# Patient Record
Sex: Male | Born: 1975 | Race: White | Hispanic: No | Marital: Married | Smoking: Current every day smoker
Health system: Southern US, Community
[De-identification: ages and names within clinical notes are randomized; demographics above are authoritative.]

## PROBLEM LIST (undated history)

## (undated) DIAGNOSIS — E109 Type 1 diabetes mellitus without complications: Secondary | ICD-10-CM

## (undated) HISTORY — DX: Type 1 diabetes mellitus without complications: E10.9

---

## 2005-09-25 ENCOUNTER — Ambulatory Visit: Payer: Self-pay | Admitting: Family Medicine

## 2009-02-23 DIAGNOSIS — E119 Type 2 diabetes mellitus without complications: Secondary | ICD-10-CM

## 2009-02-23 HISTORY — DX: Type 2 diabetes mellitus without complications: E11.9

## 2010-11-19 DIAGNOSIS — E785 Hyperlipidemia, unspecified: Secondary | ICD-10-CM | POA: Insufficient documentation

## 2010-11-19 DIAGNOSIS — E782 Mixed hyperlipidemia: Secondary | ICD-10-CM | POA: Insufficient documentation

## 2010-11-19 DIAGNOSIS — E119 Type 2 diabetes mellitus without complications: Secondary | ICD-10-CM | POA: Insufficient documentation

## 2011-03-11 DIAGNOSIS — F17200 Nicotine dependence, unspecified, uncomplicated: Secondary | ICD-10-CM | POA: Insufficient documentation

## 2011-03-11 DIAGNOSIS — Z720 Tobacco use: Secondary | ICD-10-CM | POA: Insufficient documentation

## 2011-03-11 DIAGNOSIS — K900 Celiac disease: Secondary | ICD-10-CM | POA: Insufficient documentation

## 2011-03-13 DIAGNOSIS — E139 Other specified diabetes mellitus without complications: Secondary | ICD-10-CM | POA: Insufficient documentation

## 2011-11-25 ENCOUNTER — Ambulatory Visit: Payer: Self-pay | Admitting: Family Medicine

## 2011-12-04 ENCOUNTER — Ambulatory Visit: Payer: Self-pay | Admitting: Family Medicine

## 2012-07-18 ENCOUNTER — Emergency Department: Payer: Self-pay | Admitting: Emergency Medicine

## 2012-07-18 LAB — BASIC METABOLIC PANEL
Anion Gap: 10 (ref 7–16)
BUN: 17 mg/dL (ref 7–18)
Chloride: 99 mmol/L (ref 98–107)
Co2: 23 mmol/L (ref 21–32)
Creatinine: 0.83 mg/dL (ref 0.60–1.30)
EGFR (African American): >60
Osmolality: 281 (ref 275–301)
Potassium: 3.4 mmol/L — ABNORMAL LOW (ref 3.5–5.1)

## 2012-07-18 LAB — CBC
HCT: 46.6 % (ref 40.0–52.0)
HGB: 16.2 g/dL (ref 13.0–18.0)
MCH: 31.7 pg (ref 26.0–34.0)
MCHC: 34.8 g/dL (ref 32.0–36.0)
MCV: 91 fL (ref 80–100)
Platelet: 302 10*3/uL (ref 150–440)
RDW: 12.6 % (ref 11.5–14.5)
WBC: 13.2 10*3/uL — ABNORMAL HIGH (ref 3.8–10.6)

## 2012-07-18 LAB — TROPONIN I: Troponin-I: <0.02 ng/mL

## 2012-07-19 LAB — CK TOTAL AND CKMB (NOT AT ARMC): CK-MB: 0.8 ng/mL (ref 0.5–3.6)

## 2012-08-31 DIAGNOSIS — R5383 Other fatigue: Secondary | ICD-10-CM | POA: Insufficient documentation

## 2014-05-22 DIAGNOSIS — I10 Essential (primary) hypertension: Secondary | ICD-10-CM | POA: Insufficient documentation

## 2014-11-02 IMAGING — CR DG CHEST 2V
1 series · 2 of 2 positions shown · non-contrast
Comparison: none

REASON FOR EXAM: Chest Pain
COMMENTS:

PROCEDURE:     DXR - DXR CHEST PA (OR AP) AND LATERAL  - July 18, 2012  [DATE]
RESULT:     Comparison: None.

[Series 1: w chest pa · 0.14mm/px · 2 of 2 slices shown]
[im 1/2]
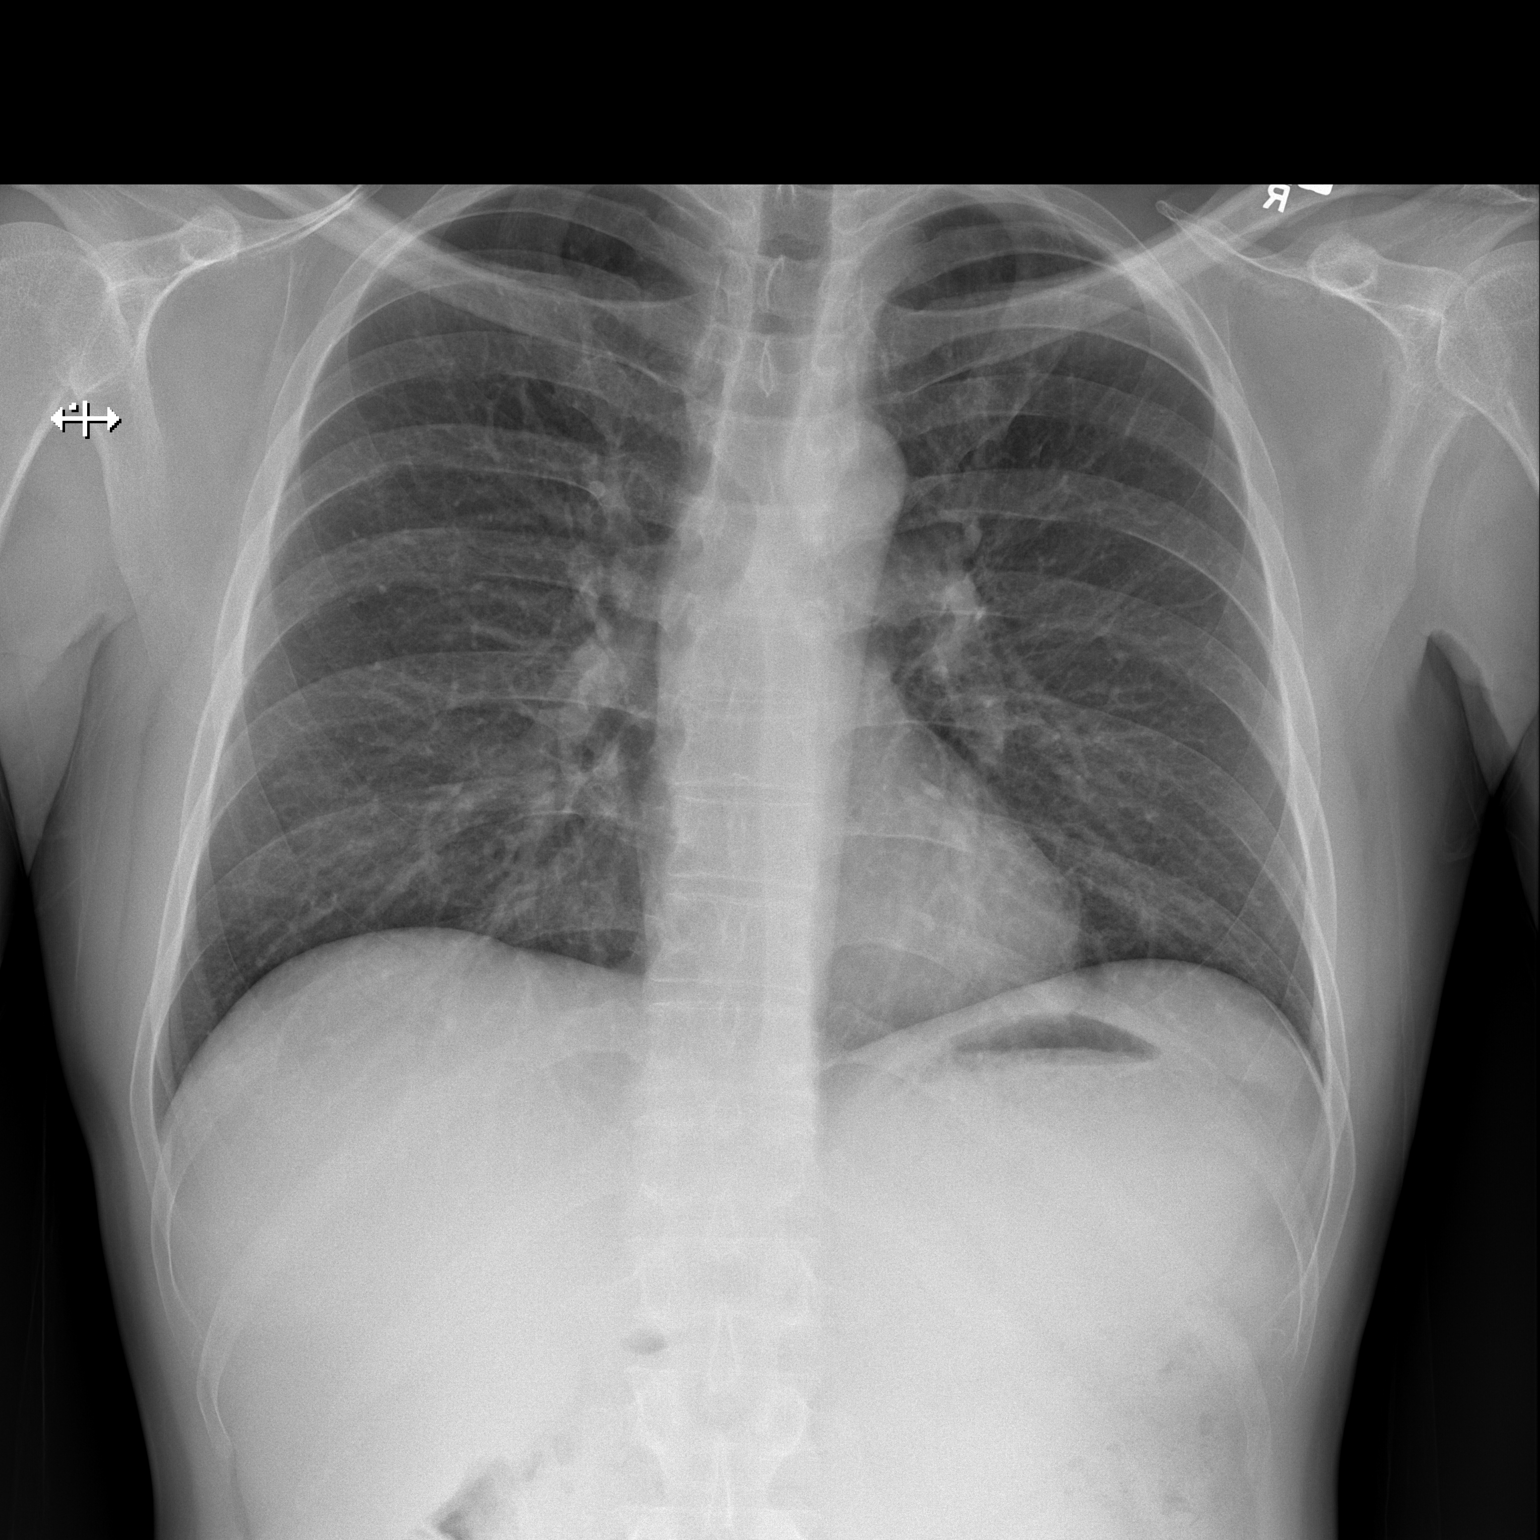
[im 2/2]
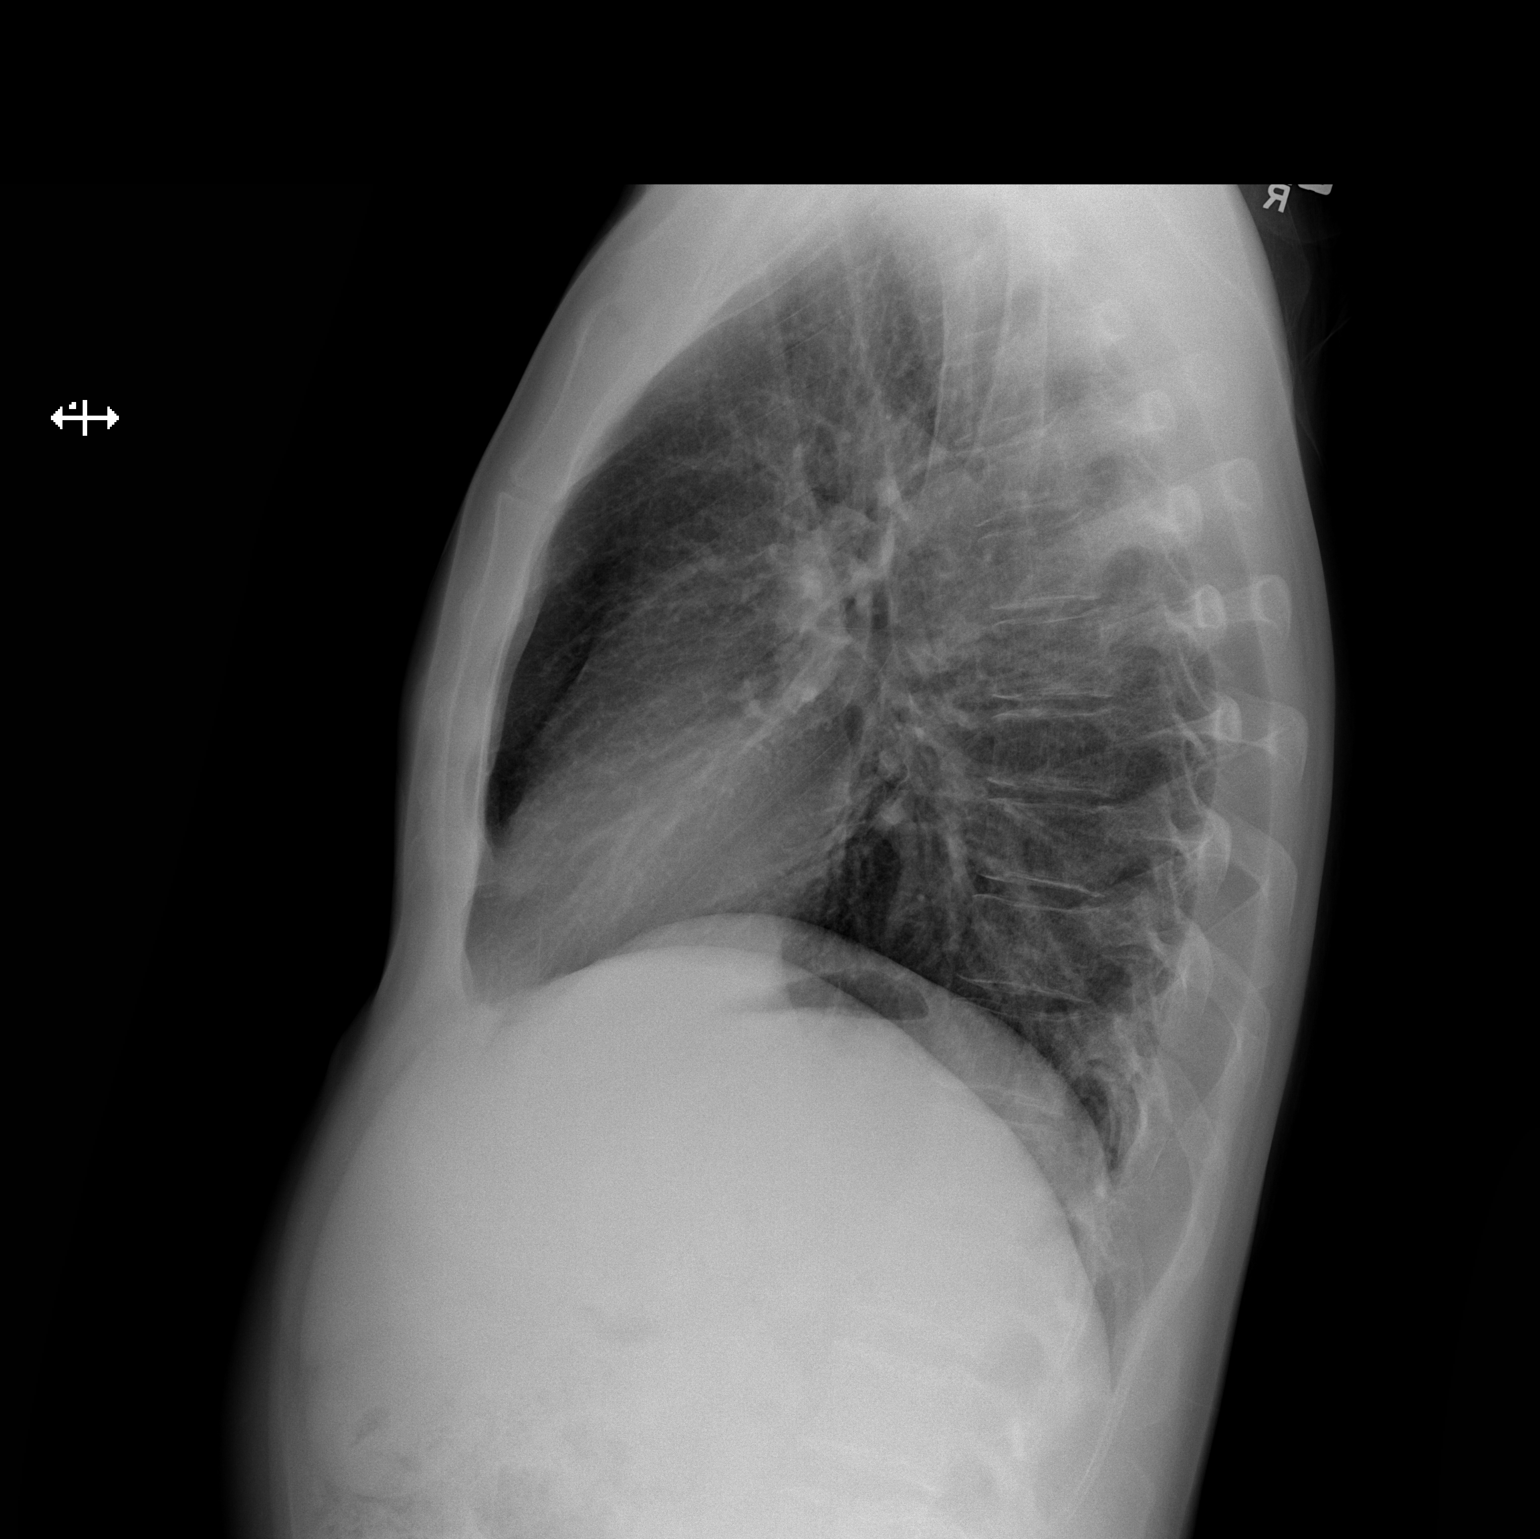

[2 of 2 positions shown; findings below may reference images not displayed]

FINDINGS: The heart and mediastinum are within normal limits. No focal pulmonary
opacities.
IMPRESSION: No acute cardiopulmonary disease.

[REDACTED]

## 2015-02-26 DIAGNOSIS — R6882 Decreased libido: Secondary | ICD-10-CM | POA: Insufficient documentation

## 2015-10-16 DIAGNOSIS — M2550 Pain in unspecified joint: Secondary | ICD-10-CM | POA: Insufficient documentation

## 2015-10-16 DIAGNOSIS — L732 Hidradenitis suppurativa: Secondary | ICD-10-CM | POA: Insufficient documentation

## 2019-03-16 ENCOUNTER — Ambulatory Visit: Payer: BLUE CROSS/BLUE SHIELD | Attending: Internal Medicine

## 2019-03-16 DIAGNOSIS — Z20822 Contact with and (suspected) exposure to covid-19: Secondary | ICD-10-CM | POA: Insufficient documentation

## 2019-03-17 LAB — NOVEL CORONAVIRUS, NAA: SARS-CoV-2, NAA: NOT DETECTED

## 2019-10-25 ENCOUNTER — Other Ambulatory Visit: Payer: Self-pay | Admitting: Unknown Physician Specialty

## 2019-10-25 ENCOUNTER — Other Ambulatory Visit: Payer: Self-pay

## 2019-10-25 ENCOUNTER — Ambulatory Visit
Admission: RE | Admit: 2019-10-25 | Discharge: 2019-10-25 | Disposition: A | Discharge status: Home / Self Care | Payer: BLUE CROSS/BLUE SHIELD | Attending: Unknown Physician Specialty | Admitting: Unknown Physician Specialty

## 2019-10-25 ENCOUNTER — Ambulatory Visit
Admission: RE | Admit: 2019-10-25 | Discharge: 2019-10-25 | Disposition: A | Discharge status: Home / Self Care | Payer: BLUE CROSS/BLUE SHIELD | Source: Ambulatory Visit | Attending: Unknown Physician Specialty | Admitting: Unknown Physician Specialty

## 2019-10-25 DIAGNOSIS — R053 Chronic cough: Secondary | ICD-10-CM

## 2019-10-25 DIAGNOSIS — R0781 Pleurodynia: Secondary | ICD-10-CM | POA: Diagnosis present

## 2019-10-25 DIAGNOSIS — S2239XA Fracture of one rib, unspecified side, initial encounter for closed fracture: Secondary | ICD-10-CM

## 2019-10-25 DIAGNOSIS — R05 Cough: Secondary | ICD-10-CM | POA: Insufficient documentation

## 2021-05-08 ENCOUNTER — Other Ambulatory Visit: Payer: BLUE CROSS/BLUE SHIELD

## 2021-05-08 ENCOUNTER — Other Ambulatory Visit: Payer: Self-pay

## 2021-05-08 NOTE — Progress Notes (Signed)
Pt cleared pre-employment UDS, HR notified. 

## 2021-08-19 ENCOUNTER — Ambulatory Visit: Payer: BLUE CROSS/BLUE SHIELD

## 2021-08-19 DIAGNOSIS — Z0000 Encounter for general adult medical examination without abnormal findings: Secondary | ICD-10-CM

## 2021-08-19 LAB — POCT URINALYSIS DIPSTICK
Bilirubin, UA: NEGATIVE
Blood, UA: NEGATIVE
Glucose, UA: POSITIVE — AB
Ketones, UA: NEGATIVE
Leukocytes, UA: NEGATIVE
Nitrite, UA: NEGATIVE
Protein, UA: POSITIVE — AB
Spec Grav, UA: >=1.03 — AB (ref 1.010–1.025)
Urobilinogen, UA: 0.2 E.U./dL
pH, UA: 5.5 (ref 5.0–8.0)

## 2021-08-19 NOTE — Progress Notes (Signed)
?  Pt presents today for physical labs, will return to clinic for scheduled physical.  

## 2021-08-20 ENCOUNTER — Ambulatory Visit: Payer: 59 | Admitting: Physician Assistant

## 2021-08-20 ENCOUNTER — Encounter: Payer: Self-pay | Admitting: Physician Assistant

## 2021-08-20 VITALS — BP 135/90 | HR 86 | Temp 97.6°F | Resp 14 | Ht 70.0 in | Wt 150.0 lb

## 2021-08-20 DIAGNOSIS — E109 Type 1 diabetes mellitus without complications: Secondary | ICD-10-CM

## 2021-08-20 LAB — CMP12+LP+TP+TSH+6AC+PSA+CBC…
ALT: 29 IU/L (ref 0–44)
AST: 22 IU/L (ref 0–40)
Albumin/Globulin Ratio: 1.6 (ref 1.2–2.2)
Albumin: 4.3 g/dL (ref 4.0–5.0)
Alkaline Phosphatase: 153 IU/L — ABNORMAL HIGH (ref 44–121)
BUN/Creatinine Ratio: 20 (ref 9–20)
BUN: 16 mg/dL (ref 6–24)
Basophils Absolute: 0.1 10*3/uL (ref 0.0–0.2)
Basos: 1 %
Bilirubin Total: 0.3 mg/dL (ref 0.0–1.2)
Calcium: 9.4 mg/dL (ref 8.7–10.2)
Chloride: 100 mmol/L (ref 96–106)
Chol/HDL Ratio: 6.6 ratio — ABNORMAL HIGH (ref 0.0–5.0)
Cholesterol, Total: 184 mg/dL (ref 100–199)
Creatinine, Ser: 0.82 mg/dL (ref 0.76–1.27)
EOS (ABSOLUTE): 0.2 10*3/uL (ref 0.0–0.4)
Eos: 2 %
Estimated CHD Risk: 1.4 times avg. — ABNORMAL HIGH (ref 0.0–1.0)
Free Thyroxine Index: 1.8 (ref 1.2–4.9)
GGT: 11 IU/L (ref 0–65)
Globulin, Total: 2.7 g/dL (ref 1.5–4.5)
Glucose: 155 mg/dL — ABNORMAL HIGH (ref 70–99)
HDL: 28 mg/dL — ABNORMAL LOW (ref >39)
Hematocrit: 48.2 % (ref 37.5–51.0)
Hemoglobin: 16.5 g/dL (ref 13.0–17.7)
Immature Grans (Abs): 0.1 10*3/uL (ref 0.0–0.1)
Immature Granulocytes: 1 %
Iron: 78 ug/dL (ref 38–169)
LDH: 157 IU/L (ref 121–224)
LDL Chol Calc (NIH): 140 mg/dL — ABNORMAL HIGH (ref 0–99)
Lymphocytes Absolute: 3.2 10*3/uL — ABNORMAL HIGH (ref 0.7–3.1)
Lymphs: 33 %
MCH: 30.8 pg (ref 26.6–33.0)
MCHC: 34.2 g/dL (ref 31.5–35.7)
MCV: 90 fL (ref 79–97)
Monocytes Absolute: 0.6 10*3/uL (ref 0.1–0.9)
Monocytes: 6 %
Neutrophils Absolute: 5.6 10*3/uL (ref 1.4–7.0)
Neutrophils: 57 %
Phosphorus: 3.5 mg/dL (ref 2.8–4.1)
Platelets: 369 10*3/uL (ref 150–450)
Potassium: 5.4 mmol/L — ABNORMAL HIGH (ref 3.5–5.2)
Prostate Specific Ag, Serum: 0.5 ng/mL (ref 0.0–4.0)
RBC: 5.35 x10E6/uL (ref 4.14–5.80)
RDW: 12.8 % (ref 11.6–15.4)
Sodium: 137 mmol/L (ref 134–144)
T3 Uptake Ratio: 27 % (ref 24–39)
T4, Total: 6.8 ug/dL (ref 4.5–12.0)
TSH: 1.02 u[IU]/mL (ref 0.450–4.500)
Total Protein: 7 g/dL (ref 6.0–8.5)
Triglycerides: 84 mg/dL (ref 0–149)
Uric Acid: 4 mg/dL (ref 3.8–8.4)
VLDL Cholesterol Cal: 16 mg/dL (ref 5–40)
WBC: 9.7 10*3/uL (ref 3.4–10.8)
eGFR: 108 mL/min/{1.73_m2} (ref >59)

## 2021-08-20 LAB — POCT GLYCOSYLATED HEMOGLOBIN (HGB A1C): Hemoglobin A1C: 7.6 % — AB (ref 4.0–5.6)

## 2021-08-20 NOTE — Addendum Note (Signed)
Addended by: Gardner Candle on: 08/20/2021 10:03 AM   Modules accepted: Orders

## 2021-08-20 NOTE — Progress Notes (Signed)
[facility] ____________________________________________   None    (approximate)  I have reviewed the triage vital signs and the nursing notes.   HISTORY  Chief Complaint Annual Exam   HPI Jeremiah Becker is a 46 y.o. male patient is a new however here today for annual physical exam.  Patient has a history of type 1 diabetes.  Patient was diagnosed with LADA in 2014.  Patient are) he became aware he is a diabetic in 2011.  Patient was taken insulin (NovoLog) until 2015 when due to unemployment he is no longer had insurance.  Patient was told by his endocrinologist to start using Novolin (R) on a sliding scale and Novolin (A) 12 units at night.  Patient fingerstick hemoglobin A1c was 7.6 today.  Fasting glucose yesterday was 155.  Microalbumin results are pending.      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 No pertinent surgical history.  Prior to Admission medications   Medication Sig Start Date End Date Taking? Authorizing Provider  blood glucose meter kit and supplies Use as directed. Check blood sugars daily. Occassionally fasting and occasionly post eating. 02/18/10  Yes [provider]  Dextrose, Diabetic Use, (RELION GLUCOSE PO) Take 10 units of lipase by mouth. Pt injects 10 units 3 times daily depending on what's consumed.   Yes [provider]    Allergies Patient has no known allergies.  Family History  Problem Relation Age of Onset   Diabetes Father     Social History Social History   Tobacco Use   Smoking status: Every Day    Types: Cigarettes   Smokeless tobacco: Never   Substance Use Topics   Alcohol use: Never   Drug use: Never    Review of Systems  Constitutional: No fever/chills Eyes: No visual changes. ENT: No sore throat. Cardiovascular: Denies chest pain. Respiratory: Denies shortness of breath. Gastrointestinal: No abdominal pain.  No nausea, no vomiting.  No diarrhea.  No constipation. Genitourinary: Negative for dysuria. Musculoskeletal: Negative for back pain. Skin: Negative for rash. Neurological: Negative for headaches, focal weakness or numbness.   Endocrine: Diabetes  ____________________________________________   PHYSICAL EXAM:  VITAL SIGNS: BP is 135/94, pulse 86, respiration 14, temperature 97.6, patient 97% O2 sat on room air.  Patient was 150 pounds and BMI is 21.52. Constitutional: Alert and oriented. Well appearing and in no acute distress. Eyes: Conjunctivae are normal. PERRL. EOMI. Head: Atraumatic. Nose: No congestion/rhinnorhea. Mouth/Throat: Mucous membranes are moist.  Oropharynx non-erythematous. Neck: No stridor.  No cervical spine tenderness to palpation. Hematological/Lymphatic/Immunilogical: No cervical lymphadenopathy. Cardiovascular: Normal rate, regular rhythm. Grossly normal heart sounds.  Good peripheral circulation. Respiratory: Normal respiratory effort.  No retractions. Lungs CTAB. Gastrointestinal: Soft and nontender. No distention. No abdominal bruits. No CVA tenderness. Genitourinary: Deferred Musculoskeletal: No lower extremity tenderness nor edema.  No joint effusions. Neurologic:  Normal speech and language. No gross focal neurologic deficits are appreciated. No gait instability.  Diabetic testicle sensory evaluation was normal. Skin:  Skin is warm, dry and intact. No rash noted. Psychiatric: Mood and affect are normal. Speech and behavior are normal.  ____________________________________________  LABS     Component Ref Range & Units 10:30  Hemoglobin A1C 4.0 - 5.6 % 7.6 Abnormal     HbA1c POC (<> result, manual entry)    HbA1c, POC (prediabetic range)    HbA1c, POC (controlled diabetic              Component Ref Range & Units 1 d ago (08/19/21) 9 yr ago (07/18/12) 9 yr ago (07/18/12)  Glucose 70 - 99 mg/dL 155 High   370 High  R    Uric Acid 3.8 - 8.4 mg/dL 4.0     Comment:            Therapeutic target for gout patients: <6.0  BUN 6 - 24 mg/dL 16  17 R    Creatinine, Ser 0.76 - 1.27 mg/dL 0.82  0.83 R    eGFR >59 mL/min/1.73 108     BUN/Creatinine Ratio 9 - 20 20     Sodium 134 - 144 mmol/L 137  132 Low  R    Potassium 3.5 - 5.2 mmol/L 5.4 High   3.4 Low  R    Chloride 96 - 106 mmol/L 100  99 R    Calcium 8.7 - 10.2 mg/dL 9.4  8.7 R    Phosphorus 2.8 - 4.1 mg/dL 3.5     Total Protein 6.0 - 8.5 g/dL 7.0     Albumin 4.0 - 5.0 g/dL 4.3     Comment:                **Effective September 01, 2021 Albumin reference interval**                   will be changing to:                              Age                  Male          Male                             0 -   7 days       3.6 - 4.9      3.6 - 4.9                             8 -  30 days       3.5 - 4.6      3.5 - 4.6                             1 -   6 months     3.7 - 4.8      3.7 - 4.8                      7 months -   2 years      4.0 - 5.0      4.0 - 5.0                             3 -   5 years      4.1 - 5.0      4.1 - 5.0  6 -  12 years      4.2 - 5.0      4.2 - 5.0                            13 -  30 years      4.3 - 5.2      4.0 - 5.0                            31 -  50 years      4.1 - 5.1      3.9 - 4.9                            51 -  60 years      3.8 - 4.9      3.8 - 4.9                            61 -  70 years      3.9 - 4.9      3.9 - 4.9                            71 -  80 years      3.8 - 4.8      3.8 - 4.8                            81 -  89 years      3.7 - 4.7      3.7 - 4.7                            90 - 199 years      3.6 - 4.6      3.6 - 4.6   Globulin,  Total 1.5 - 4.5 g/dL 2.7     Albumin/Globulin Ratio 1.2 - 2.2 1.6     Bilirubin Total 0.0 - 1.2 mg/dL 0.3     Alkaline Phosphatase 44 - 121 IU/L 153 High      LDH 121 - 224 IU/L 157     AST 0 - 40 IU/L 22     ALT 0 - 44 IU/L 29     GGT 0 - 65 IU/L 11     Iron 38 - 169 ug/dL 78     Cholesterol, Total 100 - 199 mg/dL 184     Triglycerides 0 - 149 mg/dL 84     HDL >39 mg/dL 28 Low      VLDL Cholesterol Cal 5 - 40 mg/dL 16     LDL Chol Calc (NIH) 0 - 99 mg/dL 140 High      Chol/HDL Ratio 0.0 - 5.0 ratio 6.6 High      Comment:                                   T. Chol/HDL Ratio  Men  Women                                1/2 Avg.Risk  3.4    3.3                                    Avg.Risk  5.0    4.4                                 2X Avg.Risk  9.6    7.1                                 3X Avg.Risk 23.4   11.0   Estimated CHD Risk 0.0 - 1.0 times avg. 1.4 High      Comment: The CHD Risk is based on the T. Chol/HDL ratio. Other  factors affect CHD Risk such as hypertension, smoking,  diabetes, severe obesity, and family history of  premature CHD.   TSH 0.450 - 4.500 uIU/mL 1.020     T4, Total 4.5 - 12.0 ug/dL 6.8     T3 Uptake Ratio 24 - 39 % 27     Free Thyroxine Index 1.2 - 4.9 1.8     Prostate Specific Ag, Serum 0.0 - 4.0 ng/mL 0.5     Comment: Roche ECLIA methodology.  According to the American Urological Association, Serum PSA should  decrease and remain at undetectable levels after radical  prostatectomy. The AUA defines biochemical recurrence as an initial  PSA value 0.2 ng/mL or greater followed by a subsequent confirmatory  PSA value 0.2 ng/mL or greater.  Values obtained with different assay methods or kits cannot be used  interchangeably. Results cannot be interpreted as absolute evidence  of the presence or absence of malignant disease.   WBC 3.4 - 10.8 x10E3/uL 9.7   13.2 High  R   RBC 4.14 - 5.80 x10E6/uL 5.35   5.12 R    Hemoglobin 13.0 - 17.7 g/dL 16.5     Hematocrit 37.5 - 51.0 % 48.2     MCV 79 - 97 fL 90   91 R   MCH 26.6 - 33.0 pg 30.8   31.7 R   MCHC 31.5 - 35.7 g/dL 34.2   34.8 R   RDW 11.6 - 15.4 % 12.8   12.6 R   Platelets 150 - 450 x10E3/uL 369   302 R   Neutrophils Not Estab. % 57     Lymphs Not Estab. % 33     Monocytes Not Estab. % 6     Eos Not Estab. % 2     Basos Not Estab. % 1     Neutrophils Absolute 1.4 - 7.0 x10E3/uL 5.6     Lymphocytes Absolute 0.7 - 3.1 x10E3/uL 3.2 High      Monocytes Absolute 0.1 - 0.9 x10E3/uL 0.6     EOS (ABSOLUTE) 0.0 - 0.4 x10E3/uL 0.2     Basophils Absolute 0.0 - 0.2 x10E3/uL 0.1     Immature Granulocytes Not Estab. % 1     Immature Grans          _____     Component Ref Range & Units 1 d ago  Color, UA  Dark Programmer, applications, UA  Clear   Glucose, UA Negative Positive Abnormal    Comment: 3+ (Diabetic - takes insulin)  Bilirubin, UA  Negative   Ketones, UA  Negative   Spec Grav, UA 1.010 - 1.025 >=1.030 Abnormal    Blood, UA  Negative   pH, UA 5.0 - 8.0 5.5   Protein, UA Negative Positive Abnormal    Comment: 1+  Urobilinogen, UA 0.2 or 1.0 E.U./dL 0.2   Nitrite, UA  Negative   Leukocytes, UA Negative Negative   Appearance          _______________________________________  EKG Sinus  Rhythm at 80 bpm WITHIN NORMAL LIMITS   ____________________________________________     ____________________________________________   INITIAL IMPRESSION / ASSESSMENT AND PLAN  As part of my medical decision making, I reviewed the following data within the electronic MEDICAL RECORD NUMBER      Discussed lab and EKG results with patient.  Patient advised to continue using Novolin until evaluation by endocrinologist.        ____________________________________________   FINAL CLINICAL IMPRESSION  Well exam for diagnosis of type 1 diabetes.   ED Discharge Orders          Ordered    Microalbumin / creatinine urine ratio        08/20/21  1026    Hgb A1c w/o eAG  Status:  Canceled        08/20/21 1026    POCT glycosylated hemoglobin (Hb A1C)        08/20/21 1030             Note:  This document was prepared using Dragon voice recognition software and may include unintentional dictation errors.

## 2021-08-20 NOTE — Progress Notes (Signed)
Pt presents today to complete physical, Pt denies any issues or concerns at this time/CL,RMA 

## 2021-08-21 ENCOUNTER — Encounter: Payer: BLUE CROSS/BLUE SHIELD | Admitting: Physician Assistant

## 2021-08-21 LAB — MICROALBUMIN / CREATININE URINE RATIO
Creatinine, Urine: 143.2 mg/dL
Microalb/Creat Ratio: 18 mg/g creat (ref 0–29)
Microalbumin, Urine: 26.3 ug/mL

## 2021-08-21 LAB — SPECIMEN STATUS REPORT

## 2021-08-21 LAB — HGB A1C W/O EAG: Hgb A1c MFr Bld: 9.6 % — ABNORMAL HIGH (ref 4.8–5.6)

## 2021-08-21 NOTE — Addendum Note (Signed)
Addended by: Gardner Candle on: 08/21/2021 10:12 AM   Modules accepted: Orders

## 2021-08-21 NOTE — Progress Notes (Signed)
Karren Burly previously went to [facility].  He last saw Lula Olszewski, [place] & Gerome Sam, MD.  Last documentation under Care Everywhere is 2014.  Nona Dell, PA-C is requesting an Endo referral for patient.  Contacted Dwight to see if he wants to go back to [facility].  States it's been so long since he went there & he doesn't remember who he saw.  Informed Karren Burly that we have been sending Endo referrals to Dr. Marquis Lunch of [facility] & asked if he would like for me to send the referral there.  States he is interested & agreed to having referral sent to [facility].  AMD

## 2021-08-30 DIAGNOSIS — H5213 Myopia, bilateral: Secondary | ICD-10-CM | POA: Diagnosis not present

## 2021-09-25 ENCOUNTER — Ambulatory Visit (INDEPENDENT_AMBULATORY_CARE_PROVIDER_SITE_OTHER): Payer: 59 | Admitting: "Endocrinology

## 2021-09-25 ENCOUNTER — Encounter: Payer: Self-pay | Admitting: "Endocrinology

## 2021-09-25 VITALS — BP 118/72 | HR 96 | Ht 70.0 in | Wt 155.6 lb

## 2021-09-25 DIAGNOSIS — E782 Mixed hyperlipidemia: Secondary | ICD-10-CM | POA: Diagnosis not present

## 2021-09-25 DIAGNOSIS — E1065 Type 1 diabetes mellitus with hyperglycemia: Secondary | ICD-10-CM | POA: Diagnosis not present

## 2021-09-25 DIAGNOSIS — F17200 Nicotine dependence, unspecified, uncomplicated: Secondary | ICD-10-CM

## 2021-09-25 DIAGNOSIS — R69 Illness, unspecified: Secondary | ICD-10-CM | POA: Diagnosis not present

## 2021-09-25 MED ORDER — TRESIBA FLEXTOUCH 100 UNIT/ML ~~LOC~~ SOPN: 15.0000 [IU] | PEN_INJECTOR | Freq: Every day | SUBCUTANEOUS | 1 refills | Status: DC

## 2021-09-25 MED ORDER — FIASP FLEXTOUCH 100 UNIT/ML ~~LOC~~ SOPN: 5.0000 [IU] | PEN_INJECTOR | Freq: Three times a day (TID) | SUBCUTANEOUS | 1 refills | Status: DC

## 2021-09-25 MED ORDER — FREESTYLE LIBRE 3 SENSOR MISC: 1.0000 | 2 refills | Status: DC

## 2021-09-25 NOTE — Patient Instructions (Signed)
                                     Advice for Weight Management  -For most of us the best way to lose weight is by diet management. Generally speaking, diet management means consuming less calories intentionally which over time brings about progressive weight loss.  This can be achieved more effectively by avoiding ultra processed carbohydrates, processed meats, unhealthy fats.    It is critically important to know your numbers: how much calorie you are consuming and how much calorie you need. More importantly, our carbohydrates sources should be unprocessed naturally occurring  complex starch food items.  It is always important to balance nutrition also by  appropriate intake of proteins (mainly plant-based), healthy fats/oils, plenty of fruits and vegetables.   -The American College of Lifestyle Medicine (ACL M) recommends nutrition derived mostly from Whole Food, Plant Predominant Sources example an apple instead of applesauce or apple pie. Eat Plenty of vegetables, Mushrooms, fruits, Legumes, Whole Grains, Nuts, seeds in lieu of processed meats, processed snacks/pastries red meat, poultry, eggs.  Use only water or unsweetened tea for hydration.  The College also recommends the need to stay away from risky substances including alcohol, smoking; obtaining 7-9 hours of restorative sleep, at least 150 minutes of moderate intensity exercise weekly, importance of healthy social connections, and being mindful of stress and seek help when it is overwhelming.    -Sticking to a routine mealtime to eat 3 meals a day and avoiding unnecessary snacks is shown to have a big role in weight control. Under normal circumstances, the only time we burn stored energy is when we are hungry, so allow  some hunger to take place- hunger means no food between appropriate meal times, only water.  It is not advisable to starve.   -It is better to avoid simple carbohydrates including:  Cakes, Sweet Desserts, Ice Cream, Soda (diet and regular), Sweet Tea, Candies, Chips, Cookies, Store Bought Juices, Alcohol in Excess of  1-2 drinks a day, Lemonade,  Artificial Sweeteners, Doughnuts, Coffee Creamers, "Sugar-free" Products, etc, etc.  This is not a complete list.....    -Consulting with certified diabetes educators is proven to provide you with the most accurate and current information on diet.  Also, you may be  interested in discussing diet options/exchanges , we can schedule a visit with Jeremiah Becker, RDN, CDE for individualized nutrition education.  -Exercise: If you are able: 30 -60 minutes a day ,4 days a week, or 150 minutes of moderate intensity exercise weekly.    The longer the better if tolerated.  Combine stretch, strength, and aerobic activities.  If you were told in the past that you have high risk for cardiovascular diseases, or if you are currently symptomatic, you may seek evaluation by your heart doctor prior to initiating moderate to intense exercise programs.                                  Additional Care Considerations for Diabetes/Prediabetes   -Diabetes  is a chronic disease.  The most important care consideration is regular follow-up with your diabetes care provider with the goal being avoiding or delaying its complications and to take advantage of advances in medications and technology.  If appropriate actions are taken early enough, type 2 diabetes can even be   reversed.  Seek information from the right source.  - Whole Food, Plant Predominant Nutrition is highly recommended: Eat Plenty of vegetables, Mushrooms, fruits, Legumes, Whole Grains, Nuts, seeds in lieu of processed meats, processed snacks/pastries red meat, poultry, eggs as recommended by American College of  Lifestyle Medicine (ACLM).  -Type 2 diabetes is known to coexist with other important comorbidities such as high blood pressure and high cholesterol.  It is critical to control not only the  diabetes but also the high blood pressure and high cholesterol to minimize and delay the risk of complications including coronary artery disease, stroke, amputations, blindness, etc.  The good news is that this diet recommendation for type 2 diabetes is also very helpful for managing high cholesterol and high blood blood pressure.  - Studies showed that people with diabetes will benefit from a class of medications known as ACE inhibitors and statins.  Unless there are specific reasons not to be on these medications, the standard of care is to consider getting one from these groups of medications at an optimal doses.  These medications are generally considered safe and proven to help protect the heart and the kidneys.    - People with diabetes are encouraged to initiate and maintain regular follow-up with eye doctors, foot doctors, dentists , and if necessary heart and kidney doctors.     - It is highly recommended that people with diabetes quit smoking or stay away from smoking, and get yearly  flu vaccine and pneumonia vaccine at least every 5 years.  See above for additional recommendations on exercise, sleep, stress management , and healthy social connections.      

## 2021-09-25 NOTE — Progress Notes (Signed)
Endocrinology Consult Note       09/25/2021, 6:23 PM   Subjective:    Patient ID: Jeremiah Becker, male    DOB: Jun 23, 1975.  Jeremiah Becker is being seen in consultation for management of currently uncontrolled symptomatic diabetes requested by  Patient, No Pcp Per.   Past Medical History:  Diagnosis Date   Diabetes ([place]) 2011   Diabetes mellitus type I ([place])     History reviewed. No pertinent surgical history.  Social History   Socioeconomic History   Marital status: Married    Spouse name: Not on file   Number of children: Not on file   Years of education: Not on file   Highest education level: Not on file  Occupational History   Not on file  Tobacco Use   Smoking status: Every Day    Types: Cigarettes   Smokeless tobacco: Never  Vaping Use   Vaping Use: Never used  Substance and Sexual Activity   Alcohol use: Never   Drug use: Never   Sexual activity: Not on file  Other Topics Concern   Not on file  Social History Narrative   Not on file   Social Determinants of Health   Financial Resource Strain: Not on file  Food Insecurity: Not on file  Transportation Needs: Not on file  Physical Activity: Not on file  Stress: Not on file  Social Connections: Not on file    Family History  Problem Relation Age of Onset   Hypertension Father    Diabetes Father    Hyperlipidemia Father    Heart attack Father     Outpatient Encounter Medications as of 09/25/2021  Medication Sig   Continuous Blood Gluc Sensor (FREESTYLE LIBRE 3 SENSOR) MISC 1 Piece by Does not apply route every 14 (fourteen) days. Place 1 sensor on the skin every 14 days. Use to check glucose continuously   insulin aspart (FIASP FLEXTOUCH) 100 UNIT/ML FlexTouch Pen Inject 5-11 Units into the skin 3 (three) times daily before meals.   insulin degludec (TRESIBA FLEXTOUCH) 100 UNIT/ML FlexTouch Pen Inject 15 Units into the skin  at bedtime.   [DISCONTINUED] insulin NPH Human (NOVOLIN N) 100 UNIT/ML injection Inject 5 Units into the skin. 5 units with breakfast, 5 units at lunch, 5 units at supper and 10 units at bedtime   blood glucose meter kit and supplies Use as directed. Check blood sugars daily. Occassionally fasting and occasionly post eating.   Dextrose, Diabetic Use, (RELION GLUCOSE PO) Take 10 units of lipase by mouth. Pt injects 10 units 3 times daily depending on what's consumed.   No facility-administered encounter medications on file as of 09/25/2021.    ALLERGIES: No Known Allergies  VACCINATION STATUS: Immunization History  Administered Date(s) Administered   Influenza, Seasonal, Injecte, Preservative Fre 11/29/2012   Influenza-Unspecified 12/10/2010    Diabetes He presents for his initial diabetic visit. He has type 1 diabetes mellitus. Onset time: Was diagnosed at approximate age of 37 years. His disease course has been worsening. There are no hypoglycemic associated symptoms. Pertinent negatives for hypoglycemia include no confusion, headaches, pallor or seizures. Associated symptoms  include polydipsia and polyuria. Pertinent negatives for diabetes include no chest pain, no fatigue, no polyphagia and no weakness. There are no hypoglycemic complications. Symptoms are stable. There are no diabetic complications. Risk factors for coronary artery disease include dyslipidemia, male sex and tobacco exposure. Current diabetic treatments: Novolin N 5 units with breakfast, 5 units with lunch, afternoon with supper, 10 units at bedtime. His weight is fluctuating minimally. He is following a generally unhealthy diet. When asked about meal planning, he reported none. He has not had a previous visit with a dietitian. He participates in exercise intermittently. His home blood glucose trend is fluctuating minimally. His overall blood glucose range is >200 mg/dl. (He does not monitor blood glucose regularly.  He brought a  meter showing 8 readings in the last 30 days averaging 204.  His recent A1c was 7.6%.) An ACE inhibitor/angiotensin II receptor blocker is not being taken. Eye exam is current.  Hyperlipidemia This is a chronic problem. The current episode started more than 1 year ago. The problem is uncontrolled. Exacerbating diseases include diabetes. Pertinent negatives include no chest pain, myalgias or shortness of breath. He is currently on no antihyperlipidemic treatment. Risk factors for coronary artery disease include diabetes mellitus, dyslipidemia and male sex.     Review of Systems  Constitutional:  Negative for chills, fatigue, fever and unexpected weight change.  HENT:  Negative for dental problem, mouth sores and trouble swallowing.   Eyes:  Negative for visual disturbance.  Respiratory:  Negative for cough, choking, chest tightness, shortness of breath and wheezing.   Cardiovascular:  Negative for chest pain, palpitations and leg swelling.  Gastrointestinal:  Negative for abdominal distention, abdominal pain, constipation, diarrhea, nausea and vomiting.  Endocrine: Positive for polydipsia and polyuria. Negative for polyphagia.  Genitourinary:  Negative for dysuria, flank pain, hematuria and urgency.  Musculoskeletal:  Negative for back pain, gait problem, myalgias and neck pain.  Skin:  Negative for pallor, rash and wound.  Neurological:  Negative for seizures, syncope, weakness, numbness and headaches.  Psychiatric/Behavioral:  Negative for confusion and dysphoric mood.     Objective:       09/25/2021    2:34 PM 08/20/2021   10:10 AM  Vitals with BMI  Height _0  _1   Weight 155 lbs 10 oz 150 lbs  BMI 93.23 55.73  Systolic 220 254  Diastolic 72 90  Pulse 96 86    BP 118/72   Pulse 96   Ht _2  (1.778 m)   Wt 155 lb 9.6 oz (70.6 kg)   BMI 22.33 kg/m   Wt Readings from Last 3 Encounters:  09/25/21 155 lb 9.6 oz (70.6 kg)  08/20/21 150 lb (68 kg)     Physical  Exam Constitutional:      General: He is not in acute distress.    Appearance: He is well-developed.  HENT:     Head: Normocephalic and atraumatic.  Neck:     Thyroid: No thyromegaly.     Trachea: No tracheal deviation.  Cardiovascular:     Rate and Rhythm: Normal rate.     Pulses:          Dorsalis pedis pulses are 1+ on the right side and 1+ on the left side.       Posterior tibial pulses are 1+ on the right side and 1+ on the left side.     Heart sounds: S1 normal and S2 normal.  Pulmonary:     Effort: Pulmonary effort is  normal. No respiratory distress.     Breath sounds: No wheezing.  Musculoskeletal:     Right shoulder: No swelling or deformity.     Cervical back: Normal range of motion and neck supple.  Skin:    General: Skin is warm and dry.     Findings: No rash.     Nails: There is no clubbing.  Neurological:     Mental Status: He is alert and oriented to person, place, and time.     Cranial Nerves: No cranial nerve deficit.     Sensory: No sensory deficit.     Gait: Gait normal.     Deep Tendon Reflexes: Reflexes are normal and symmetric.  Psychiatric:        Speech: Speech normal.        Behavior: Behavior normal. Behavior is cooperative.        Thought Content: Thought content normal.        Judgment: Judgment normal.       CMP ( most recent) CMP     Component Value Date/Time   NA 137 08/19/2021 1024   NA 132 (L) 07/18/2012 1827   K 5.4 (H) 08/19/2021 1024   K 3.4 (L) 07/18/2012 1827   CL 100 08/19/2021 1024   CL 99 07/18/2012 1827   CO2 23 07/18/2012 1827   GLUCOSE 155 (H) 08/19/2021 1024   GLUCOSE 370 (H) 07/18/2012 1827   BUN 16 08/19/2021 1024   BUN 17 07/18/2012 1827   CREATININE 0.82 08/19/2021 1024   CREATININE 0.83 07/18/2012 1827   CALCIUM 9.4 08/19/2021 1024   CALCIUM 8.7 07/18/2012 1827   PROT 7.0 08/19/2021 1024   ALBUMIN 4.3 08/19/2021 1024   AST 22 08/19/2021 1024   ALT 29 08/19/2021 1024   ALKPHOS 153 (H) 08/19/2021 1024    BILITOT 0.3 08/19/2021 1024   GFRNONAA >60 07/18/2012 1827   GFRAA >60 07/18/2012 1827     Diabetic Labs (most recent): Lab Results  Component Value Date   HGBA1C 7.6 (A) 08/20/2021   HGBA1C 9.6 (H) 08/19/2021     Lipid Panel ( most recent) Lipid Panel     Component Value Date/Time   CHOL 184 08/19/2021 1024   TRIG 84 08/19/2021 1024   HDL 28 (L) 08/19/2021 1024   CHOLHDL 6.6 (H) 08/19/2021 1024   LDLCALC 140 (H) 08/19/2021 1024   LABVLDL 16 08/19/2021 1024      Lab Results  Component Value Date   TSH 1.020 08/19/2021     Assessment & Plan:   1. Type 1 diabetes mellitus with hyperglycemia (HCC)   - Jeremiah Becker has currently uncontrolled symptomatic type 2 DM since  46 years of age,  with most recent A1c of 7.6 %. Recent labs reviewed. - I had a long discussion with him about the progressive nature of diabetes and the pathology behind its complications. -his diabetes is complicated by smoking, hyperlipidemia and he remains at a high risk for more acute and chronic complications which include CAD, CVA, CKD, retinopathy, and neuropathy. These are all discussed in detail with him.  - I discussed all available options of managing his diabetes including de-escalation of medications. I have counseled him on diet  and weight management  by adopting a Whole Food , Plant Predominant  ( WFPP) nutrition as recommended by [employer] of Lifestyle Medicine. Patient is encouraged to switch to  unprocessed or minimally processed  complex starch, adequate protein intake (mainly plant source), minimal liquid fat (  mainly vegetable oils), plenty of fruits, and vegetables. -  he is advised to stick to a routine mealtimes to eat 3 complete meals a day and snack only when necessary ( to snack only to correct hypoglycemia BG <70 day time or <100 at night).   - he acknowledges that there is a room for improvement in his food and drink choices. - Further Specific Suggestion is made for him  to avoid simple carbohydrates  from his diet including Cakes, Sweet Desserts, Ice Cream, Soda (diet and regular), Sweet Tea, Candies, Chips, Cookies, Store Bought Juices, Alcohol ,  Artificial Sweeteners,  Coffee Creamer, and "Sugar-free" Products. This will help patient to have more stable blood glucose profile and potentially avoid unintended weight gain.  The following Lifestyle Medicine recommendations according to [facility]) were discussed and offered to patient and he agrees to start the journey:  A. Whole Foods, Plant-based plate comprising of fruits and vegetables, plant-based proteins, whole-grain carbohydrates was discussed in detail with the patient.   A list for source of those nutrients were also provided to the patient.  Patient will use only water or unsweetened tea for hydration. B.  The need to stay away from risky substances including alcohol, smoking; obtaining 7 to 9 hours of restorative sleep, at least 150 minutes of moderate intensity exercise weekly, the importance of healthy social connections,  and stress reduction techniques were discussed. C.  A full color page of  Calorie density of various food groups per pound showing examples of each food groups was provided to the patient.  - he will be scheduled with Jearld Fenton, RDN, CDE for individualized diabetes education.  - I have approached him with the following plan to manage  his diabetes and patient agrees:   -He would be considered for insulin dose.  I discussed and prescribed Tresiba 15 units nightly (sample of Toujeo was given from clinic), and Fiasp 5-11 units 3 times daily AC for Preme[place] blood glucose above 90 mg per DL.  A sample of Lyumjev was given from the clinic to cover for fast acting insulin.    This patient would benefit from a CGM.  I discussed and prescribed the freestyle libre 3 device for him.   In the meantime, he is approached to start monitoring blood glucose 4 times  a day using his meter-  -before meals and at bedtime. - he is warned not to take insulin without proper monitoring per orders. - Adjustment parameters are given to him for hypo and hyperglycemia in writing. - he is encouraged to call clinic for blood glucose levels less than 70 or above 200 mg /dl. He will be treated exclusively with insulin for now.  However, this patient will need work-up with anti-GAD and antithyroid antibodies to classify his diabetes properly.  - Specific targets for  A1c;  LDL, HDL,  and Triglycerides were discussed with the patient.  2) Blood Pressure /Hypertension:  his blood pressure is  controlled to target.  He is not on any antihypertensive medications   3) Lipids/Hyperlipidemia:   Review of his recent lipid panel showed un controlled  LDL at 140 .  The whole food plant-based diet discussed above will help with dyslipidemia.  He is not on statins.  He will be considered for statin intervention after his next lipid panel.    4)  Weight/Diet:  Body mass index is 22.33 kg/m.  -   he is not a candidate for weight loss. I  discussed with him the fact that loss of 5 - 10% of his  current body weight will have the most impact on his diabetes management.  The above detailed  ACLM recommendations for nutrition, exercise, sleep, social life, avoidance of risky substances, the need for restorative sleep   information will also detailed on discharge instructions.  5) Chronic Care/Health Maintenance:  -he  is not  on ACEI/ARB and Statin medications and  is encouraged to initiate and continue to follow up with Ophthalmology, Dentist,  Podiatrist at least yearly or according to recommendations, and advised to  quit smoking. I have recommended yearly flu vaccine and pneumonia vaccine at least every 5 years; moderate intensity exercise for up to 150 minutes weekly; and  sleep for 7- 9 hours a day.   The patient was counseled on the dangers of tobacco use, and was advised to quit.   Reviewed strategies to maximize success, including removing cigarettes and smoking materials from environment.  - he is  advised to maintain close follow up with Patient, No Pcp Per for primary care needs, as well as his other providers for optimal and coordinated care.   I spent 61 minutes in the care of the patient today including review of labs from [place], Lipids, Thyroid Function, Hematology (current and previous including abstractions from other facilities); face-to-face time discussing  his blood glucose readings/logs, discussing hypoglycemia and hyperglycemia episodes and symptoms, medications doses, his options of short and long term treatment based on the latest standards of care / guidelines;  discussion about incorporating lifestyle medicine;  and documenting the encounter. Risk reduction counseling performed per USPSTF guidelines to reduce obesity and cardiovascular risk factors.      Please refer to Patient Instructions for Blood Glucose Monitoring and Insulin/Medications Dosing Guide"  in media tab for additional information. Please  also refer to " Patient Self Inventory" in the Media  tab for reviewed elements of pertinent patient history.  Clarene Critchley participated in the discussions, expressed understanding, and voiced agreement with the above plans.  All questions were answered to his satisfaction. he is encouraged to contact clinic should he have any questions or concerns prior to his return visit.   Follow up plan: - Return in about 2 weeks (around 10/09/2021) for F/U with Meter/CGM [facility] Only - no Labs.  Glade Lloyd, MD [facility] [address] [place], [place] [address] Phone: [phone]  Fax: [phone]    09/25/2021, 6:23 PM  This note was partially dictated with voice recognition software. Similar sounding words can be transcribed inadequately or may not  be corrected upon review.

## 2021-09-29 ENCOUNTER — Other Ambulatory Visit: Payer: Self-pay

## 2021-09-29 ENCOUNTER — Telehealth: Payer: Self-pay

## 2021-09-29 DIAGNOSIS — E1065 Type 1 diabetes mellitus with hyperglycemia: Secondary | ICD-10-CM

## 2021-09-29 MED ORDER — DEXCOM G7 RECEIVER DEVI: 0 refills | Status: DC

## 2021-09-29 MED ORDER — DEXCOM G7 SENSOR MISC: 2 refills | Status: DC

## 2021-09-29 NOTE — Telephone Encounter (Signed)
Left a message requesting pt return call to the office. ?

## 2021-09-30 ENCOUNTER — Other Ambulatory Visit: Payer: Self-pay

## 2021-09-30 DIAGNOSIS — E1065 Type 1 diabetes mellitus with hyperglycemia: Secondary | ICD-10-CM

## 2021-09-30 MED ORDER — FIASP FLEXTOUCH 100 UNIT/ML ~~LOC~~ SOPN: 5.0000 [IU] | PEN_INJECTOR | Freq: Three times a day (TID) | SUBCUTANEOUS | 1 refills | Status: DC

## 2021-09-30 MED ORDER — TRESIBA FLEXTOUCH 100 UNIT/ML ~~LOC~~ SOPN: 15.0000 [IU] | PEN_INJECTOR | Freq: Every day | SUBCUTANEOUS | 1 refills | Status: DC

## 2021-10-09 ENCOUNTER — Ambulatory Visit: Payer: 59 | Admitting: "Endocrinology

## 2021-10-21 ENCOUNTER — Encounter: Payer: Self-pay | Admitting: "Endocrinology

## 2021-10-21 ENCOUNTER — Ambulatory Visit: Payer: 59 | Admitting: "Endocrinology

## 2021-11-05 ENCOUNTER — Other Ambulatory Visit: Payer: Self-pay

## 2021-11-05 DIAGNOSIS — E1065 Type 1 diabetes mellitus with hyperglycemia: Secondary | ICD-10-CM

## 2021-11-05 MED ORDER — TRESIBA FLEXTOUCH 100 UNIT/ML ~~LOC~~ SOPN: 15.0000 [IU] | PEN_INJECTOR | Freq: Every day | SUBCUTANEOUS | 1 refills | Status: DC

## 2022-02-08 IMAGING — CR DG RIBS 2V*R*
1 series · 4 of 4 positions shown · non-contrast
Comparison: Chest x-ray 07/18/2012.

CLINICAL DATA: 44-year-old male with history of chest pain. Dry
cough for 6 months. Recent injury with popping sensation on the
right side 2 weeks ago.

EXAM:
CHEST - 2 VIEW; RIGHT RIBS - 2 VIEW

[Series 1: dg ribs unilateral right · 0.14mm/px · 4 of 4 slices shown]
[im 1/4]
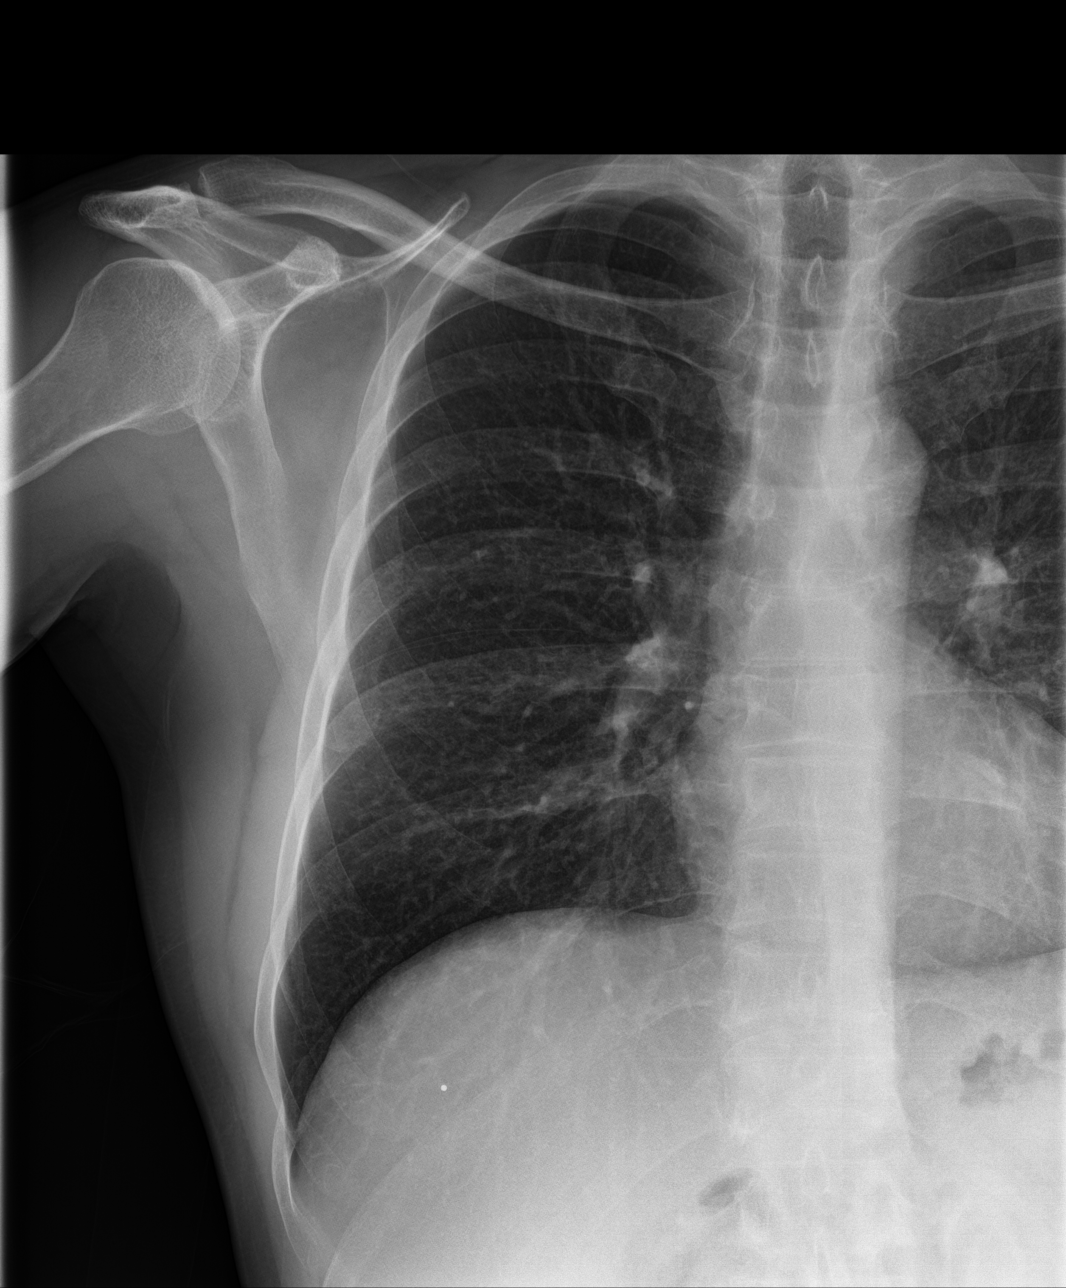
[im 2/4]
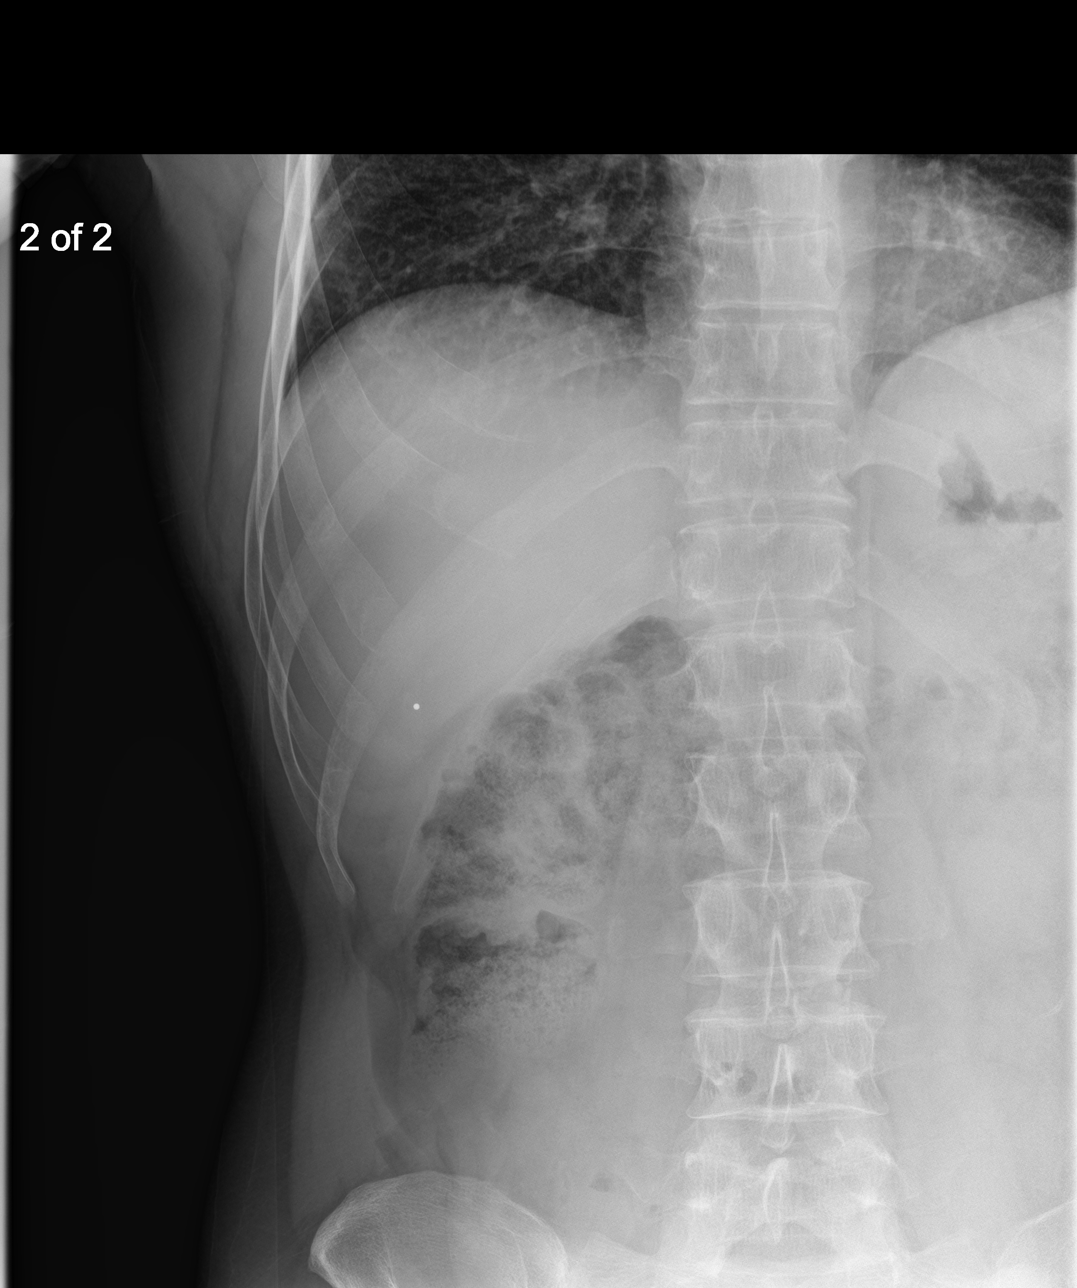
[im 3/4]
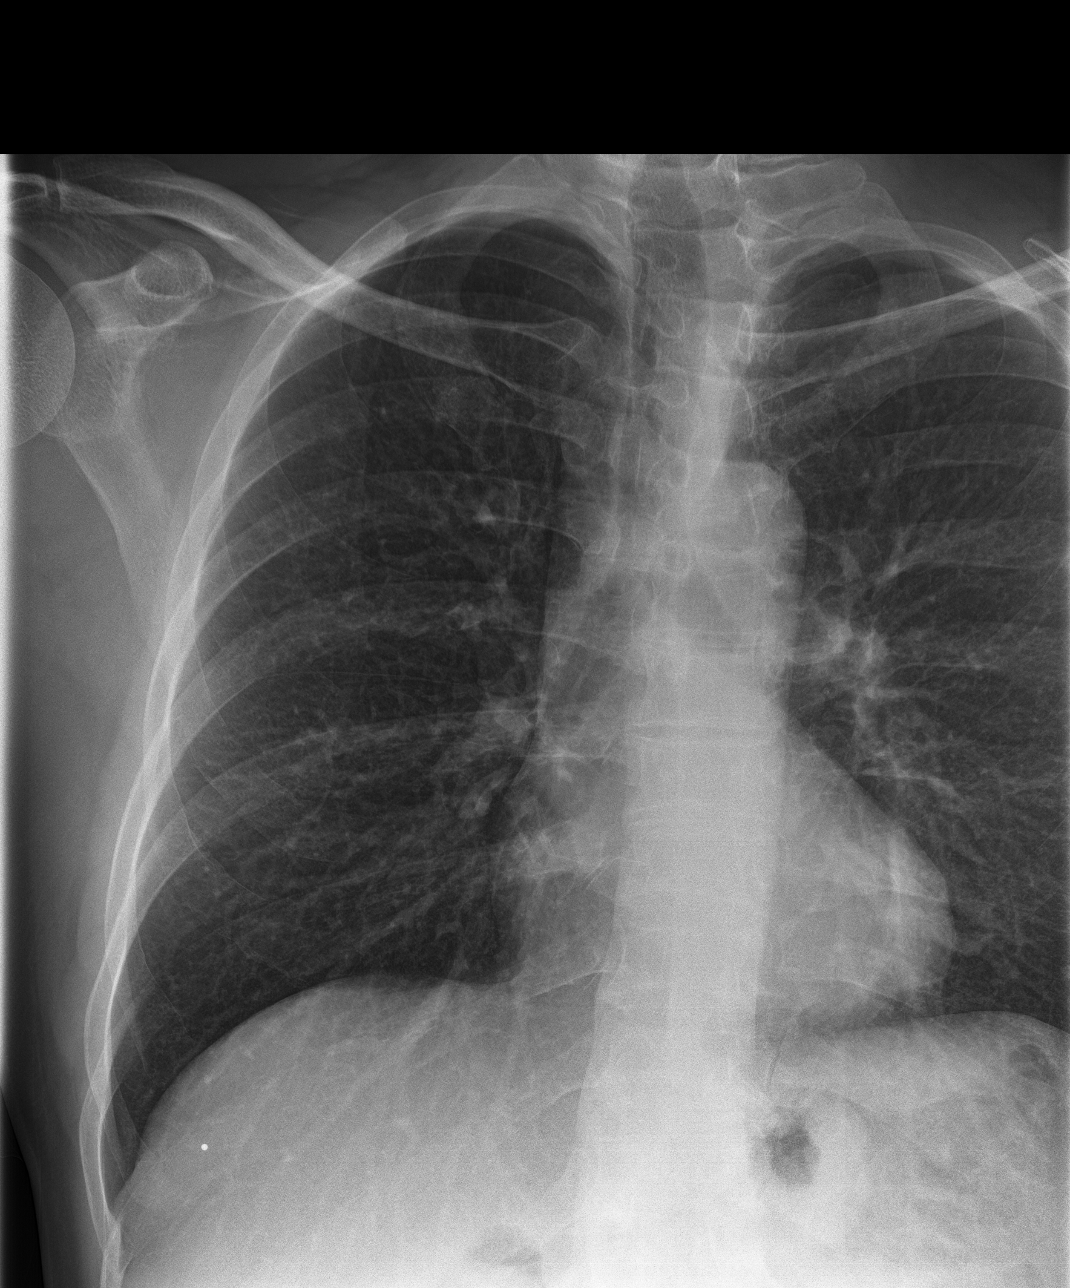
[im 4/4]
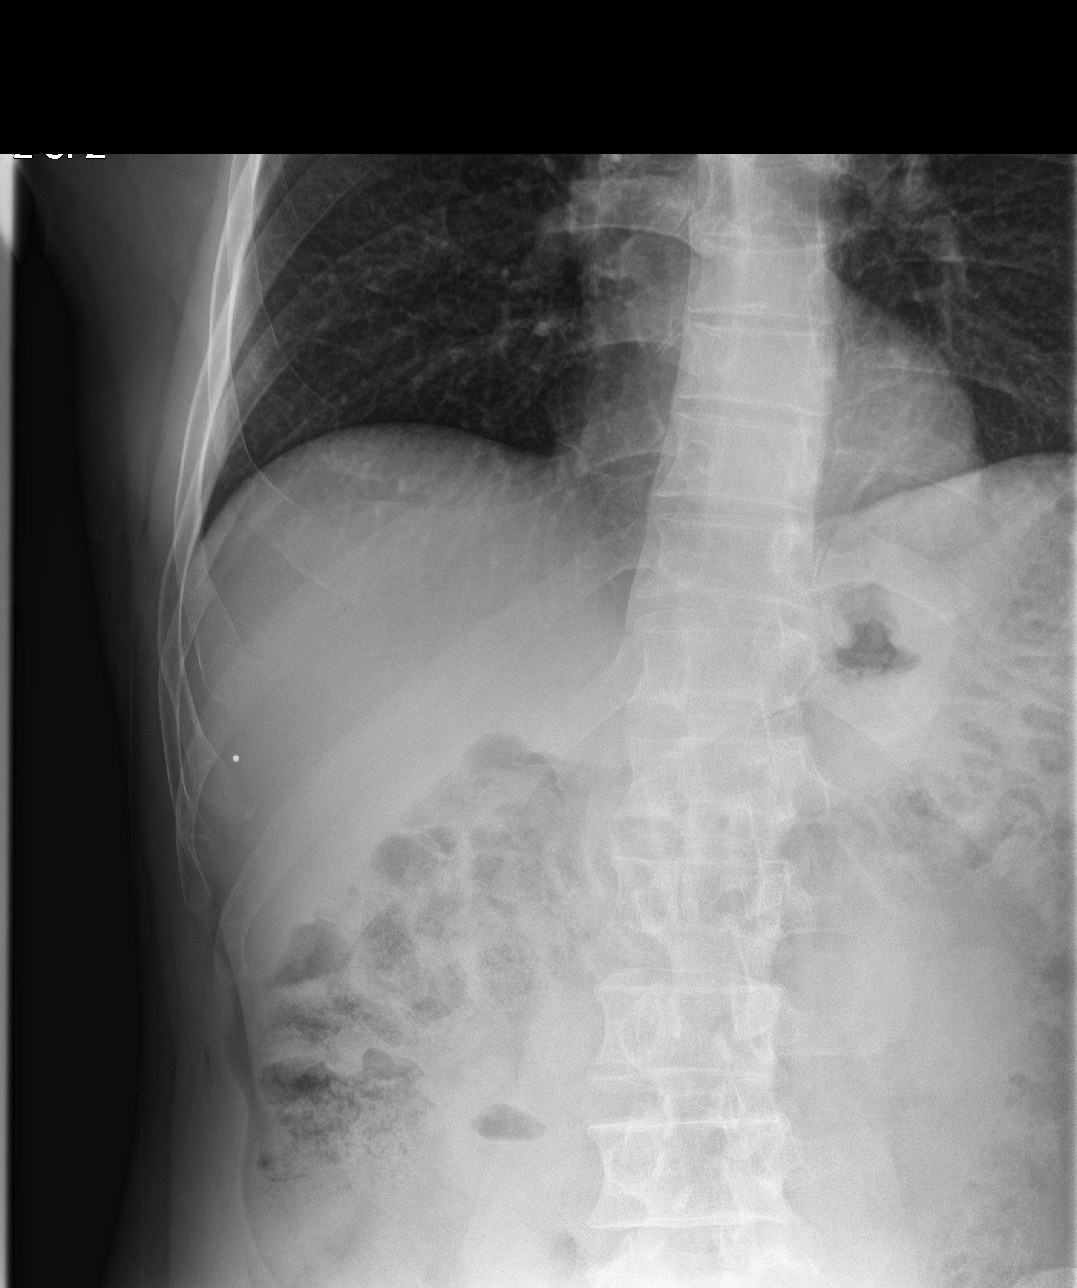

[4 of 4 positions shown; findings below may reference images not displayed]

FINDINGS: Mild diffuse peribronchial cuffing. Lung volumes are normal. No
consolidative airspace disease. No pleural effusions. No
pneumothorax. No pulmonary nodule or mass noted. Pulmonary
vasculature and the cardiomediastinal silhouette are within normal
limits.

Dedicated views of the right ribs demonstrate no definite acute
displaced right-sided rib fractures.
IMPRESSION: 1. No acute displaced right-sided rib fractures.
2. Mild diffuse peribronchial cuffing, suggestive of bronchitis.

## 2022-06-09 ENCOUNTER — Ambulatory Visit: Payer: Self-pay

## 2022-06-09 DIAGNOSIS — Z0000 Encounter for general adult medical examination without abnormal findings: Secondary | ICD-10-CM

## 2022-06-09 LAB — POCT URINALYSIS DIPSTICK
Bilirubin, UA: NEGATIVE
Blood, UA: NEGATIVE
Glucose, UA: POSITIVE — AB
Ketones, UA: NEGATIVE
Leukocytes, UA: NEGATIVE
Nitrite, UA: NEGATIVE
Protein, UA: NEGATIVE
Spec Grav, UA: 1.025 (ref 1.010–1.025)
Urobilinogen, UA: 0.2 E.U./dL
pH, UA: 6 (ref 5.0–8.0)

## 2022-06-09 NOTE — Progress Notes (Signed)
Completed labs for annual physical.  

## 2022-06-10 LAB — CMP12+LP+TP+TSH+6AC+PSA+CBC…
ALT: 24 IU/L (ref 0–44)
AST: 22 IU/L (ref 0–40)
Albumin/Globulin Ratio: 1.4 (ref 1.2–2.2)
Albumin: 4 g/dL — ABNORMAL LOW (ref 4.1–5.1)
Alkaline Phosphatase: 141 IU/L — ABNORMAL HIGH (ref 44–121)
BUN/Creatinine Ratio: 20 (ref 9–20)
BUN: 13 mg/dL (ref 6–24)
Basophils Absolute: 0.1 10*3/uL (ref 0.0–0.2)
Basos: 1 %
Bilirubin Total: 0.3 mg/dL (ref 0.0–1.2)
Calcium: 9 mg/dL (ref 8.7–10.2)
Chloride: 102 mmol/L (ref 96–106)
Chol/HDL Ratio: 5.9 ratio — ABNORMAL HIGH (ref 0.0–5.0)
Cholesterol, Total: 160 mg/dL (ref 100–199)
Creatinine, Ser: 0.64 mg/dL — ABNORMAL LOW (ref 0.76–1.27)
EOS (ABSOLUTE): 0.2 10*3/uL (ref 0.0–0.4)
Eos: 2 %
Estimated CHD Risk: 1.3 times avg. — ABNORMAL HIGH (ref 0.0–1.0)
Free Thyroxine Index: 2.2 (ref 1.2–4.9)
GGT: 9 IU/L (ref 0–65)
Globulin, Total: 2.8 g/dL (ref 1.5–4.5)
Glucose: 153 mg/dL — ABNORMAL HIGH (ref 70–99)
HDL: 27 mg/dL — ABNORMAL LOW (ref >39)
Hematocrit: 47.5 % (ref 37.5–51.0)
Hemoglobin: 16.2 g/dL (ref 13.0–17.7)
Immature Grans (Abs): 0 10*3/uL (ref 0.0–0.1)
Immature Granulocytes: 0 %
Iron: 100 ug/dL (ref 38–169)
LDH: 146 IU/L (ref 121–224)
LDL Chol Calc (NIH): 120 mg/dL — ABNORMAL HIGH (ref 0–99)
Lymphocytes Absolute: 2.8 10*3/uL (ref 0.7–3.1)
Lymphs: 25 %
MCH: 30.5 pg (ref 26.6–33.0)
MCHC: 34.1 g/dL (ref 31.5–35.7)
MCV: 89 fL (ref 79–97)
Monocytes Absolute: 0.6 10*3/uL (ref 0.1–0.9)
Monocytes: 5 %
Neutrophils Absolute: 7.4 10*3/uL — ABNORMAL HIGH (ref 1.4–7.0)
Neutrophils: 67 %
Phosphorus: 3.4 mg/dL (ref 2.8–4.1)
Platelets: 312 10*3/uL (ref 150–450)
Potassium: 4.3 mmol/L (ref 3.5–5.2)
Prostate Specific Ag, Serum: 0.5 ng/mL (ref 0.0–4.0)
RBC: 5.32 x10E6/uL (ref 4.14–5.80)
RDW: 12.8 % (ref 11.6–15.4)
Sodium: 137 mmol/L (ref 134–144)
T3 Uptake Ratio: 30 % (ref 24–39)
T4, Total: 7.2 ug/dL (ref 4.5–12.0)
TSH: 0.822 u[IU]/mL (ref 0.450–4.500)
Total Protein: 6.8 g/dL (ref 6.0–8.5)
Triglycerides: 68 mg/dL (ref 0–149)
Uric Acid: 3.3 mg/dL — ABNORMAL LOW (ref 3.8–8.4)
VLDL Cholesterol Cal: 13 mg/dL (ref 5–40)
WBC: 11.1 10*3/uL — ABNORMAL HIGH (ref 3.4–10.8)
eGFR: 116 mL/min/{1.73_m2} (ref >59)

## 2022-06-11 ENCOUNTER — Encounter: Payer: Self-pay | Admitting: Physician Assistant

## 2022-06-11 ENCOUNTER — Ambulatory Visit: Payer: Self-pay | Admitting: Physician Assistant

## 2022-06-11 VITALS — BP 143/85 | HR 100 | Temp 98.1°F | Resp 12 | Ht 71.0 in | Wt 160.0 lb

## 2022-06-11 DIAGNOSIS — E109 Type 1 diabetes mellitus without complications: Secondary | ICD-10-CM

## 2022-06-11 DIAGNOSIS — Z0000 Encounter for general adult medical examination without abnormal findings: Secondary | ICD-10-CM

## 2022-06-11 LAB — POCT GLYCOSYLATED HEMOGLOBIN (HGB A1C): HbA1c POC (<> result, manual entry): 9.3 % (ref 4.0–5.6)

## 2022-06-11 NOTE — Progress Notes (Signed)
[facility]  ____________________________________________   None    (approximate)  I have reviewed the triage vital signs and the nursing notes.   HISTORY  Chief Complaint Annual Exam   HPI Jeremiah Becker is a 47 y.o. male presents for annual physical exam.  Patient was no concerns or complaints.  Patient has history of type 1 diabetes and is followed by endocrinology.         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  Prior to Admission medications   Medication Sig Start Date End Date Taking? Authorizing Provider  blood glucose meter kit and supplies Use as directed. Check blood sugars daily. Occassionally fasting and occasionly post eating. 02/18/10   [provider]  insulin aspart (FIASP FLEXTOUCH) 100 UNIT/ML FlexTouch Pen Inject 5-11 Units into the skin 3 (three) times daily before meals. 09/30/21   Roma Kayser, MD  insulin degludec (TRESIBA FLEXTOUCH) 100 UNIT/ML FlexTouch Pen Inject 15 Units into the skin at bedtime. 11/05/21   Roma Kayser, MD    Allergies Patient has no known allergies.  Family History  Problem Relation Age of Onset   Hypertension Father    Diabetes Father    Hyperlipidemia Father    Heart attack Father     Social History Social History   Tobacco Use   Smoking status: Every Day    Types: Cigarettes   Smokeless tobacco: Never  Vaping Use   Vaping Use: Never used  Substance Use Topics   Alcohol use: Never   Drug use: Never    Review of  Systems  Constitutional: No fever/chills Eyes: No visual changes. ENT: No sore throat. Cardiovascular: Denies chest pain. Respiratory: Denies shortness of breath. Gastrointestinal: No abdominal pain.  No nausea, no vomiting.  No diarrhea.  No constipation. Genitourinary: Negative for dysuria. Musculoskeletal: Negative for back pain. Skin: Negative for rash. Neurological: Negative for headaches, focal weakness or numbness.  ____________________________________________   PHYSICAL EXAM:  VITAL SIGNS: BP 133/96 143/85  Pulse 101 100  Resp 12   Temp 98.1 F (36.7 C)   Temp src Temporal   SpO2 95 %   Weight 160 lb (72.6 kg)   Height  (1.803 m)    BMI 22.32 kg/m2  BSA 1.91 m2   Constitutional: Alert and oriented. Well appearing and in no acute distress. Eyes: Conjunctivae are normal. PERRL. EOMI. Head: Atraumatic. Nose: No congestion/rhinnorhea. Mouth/Throat: Mucous membranes are moist.  Oropharynx non-erythematous. Neck: No stridor.  No cervical spine tenderness to palpation. Hematological/Lymphatic/Immunilogical: No cervical lymphadenopathy. Cardiovascular: Normal rate, regular rhythm. Grossly normal heart sounds.  Good peripheral circulation. Respiratory: Normal respiratory effort.  No retractions. Lungs CTAB. Gastrointestinal: Soft and nontender. No distention. No abdominal bruits. No CVA tenderness. Genitourinary: Deferred Musculoskeletal: No lower extremity tenderness nor edema.  No joint effusions. Neurologic:  Normal speech and language. No gross focal neurologic deficits are appreciated. No gait instability. Skin:  Skin is warm, dry and intact. No rash noted. Psychiatric: Mood and affect are normal. Speech  and behavior are normal.  ____________________________________________   LABS        Component Ref Range & Units 2 d ago (06/09/22) 9 mo ago (08/19/21) 9 yr ago (07/18/12) 9 yr ago (07/18/12)  Glucose 70 - 99 mg/dL 914 High  782 High  956 High  R    Uric Acid 3.8 - 8.4 mg/dL 3.3 Low  4.0 CM    Comment:            Therapeutic target for gout patients: <6.0  BUN 6 - 24 mg/dL 13 16 17  R   Creatinine, Ser 0.76 - 1.27 mg/dL 2.13 Low  0.86 5.78 R   eGFR >59 mL/min/1.73 116 108    BUN/Creatinine Ratio 9 - 20 20 20     Sodium 134 - 144 mmol/L 137 137 132 Low  R   Potassium 3.5 - 5.2 mmol/L 4.3 5.4 High  3.4 Low  R   Chloride 96 - 106 mmol/L 102 100 99 R   Calcium 8.7 - 10.2 mg/dL 9.0 9.4 8.7 R   Phosphorus 2.8 - 4.1 mg/dL 3.4 3.5    Total Protein 6.0 - 8.5 g/dL 6.8 7.0    Albumin 4.1 - 5.1 g/dL 4.0 Low  4.3 R, CM    Globulin, Total 1.5 - 4.5 g/dL 2.8 2.7    Albumin/Globulin Ratio 1.2 - 2.2 1.4 1.6    Bilirubin Total 0.0 - 1.2 mg/dL 0.3 0.3    Alkaline Phosphatase 44 - 121 IU/L 141 High  153 High     LDH 121 - 224 IU/L 146 157    AST 0 - 40 IU/L 22 22    ALT 0 - 44 IU/L 24 29    GGT 0 - 65 IU/L 9 11    Iron 38 - 169 ug/dL 469 78    Cholesterol, Total 100 - 199 mg/dL 629 528    Triglycerides 0 - 149 mg/dL 68 84    HDL >41 mg/dL 27 Low  28 Low     VLDL Cholesterol Cal 5 - 40 mg/dL 13 16    LDL Chol Calc (NIH) 0 - 99 mg/dL 324 High  401 High     Chol/HDL Ratio 0.0 - 5.0 ratio 5.9 High  6.6 High  CM    Comment:                                   T. Chol/HDL Ratio                                             Men  Women                               1/2 Avg.Risk  3.4    3.3                                   Avg.Risk  5.0    4.4                                2X Avg.Risk  9.6    7.1  3X Avg.Risk 23.4   11.0  Estimated CHD Risk 0.0 - 1.0 times avg. 1.3 High  1.4 High  CM    Comment: The CHD Risk is based on the T. Chol/HDL ratio. Other factors affect CHD Risk such as hypertension, smoking, diabetes, severe obesity, and family history of premature CHD.  TSH 0.450 - 4.500 uIU/mL 0.822 1.020    T4, Total 4.5 - 12.0 ug/dL 7.2 6.8    T3 Uptake Ratio 24 - 39 % 30 27    Free Thyroxine  Index 1.2 - 4.9 2.2 1.8    Prostate Specific Ag, Serum 0.0 - 4.0 ng/mL 0.5 0.5 CM    Comment: Roche ECLIA methodology. According to the American Urological Association, Serum PSA should decrease and remain at undetectable levels after radical prostatectomy. The AUA defines biochemical recurrence as an initial PSA value 0.2 ng/mL or greater followed by a subsequent confirmatory PSA value 0.2 ng/mL or greater. Values obtained with different assay methods or kits cannot be used interchangeably. Results cannot be interpreted as absolute evidence of the presence or absence of malignant disease.  WBC 3.4 - 10.8 x10E3/uL 11.1 High  9.7  13.2 High  R  RBC 4.14 - 5.80 x10E6/uL 5.32 5.35  5.12 R  Hemoglobin 13.0 - 17.7 g/dL 16.1 09.6    Hematocrit 37.5 - 51.0 % 47.5 48.2    MCV 79 - 97 fL 89 90  91 R  MCH 26.6 - 33.0 pg 30.5 30.8  31.7 R  MCHC 31.5 - 35.7 g/dL 04.5 40.9  81.1 R  RDW 11.6 - 15.4 % 12.8 12.8  12.6 R  Platelets 150 - 450 x10E3/uL 312 369  302 R  Neutrophils Not Estab. % 67 57    Lymphs Not Estab. % 25 33    Monocytes Not Estab. % 5 6    Eos Not Estab. % 2 2    Basos Not Estab. % 1 1    Neutrophils Absolute 1.4 - 7.0 x10E3/uL 7.4 High  5.6    Lymphocytes Absolute 0.7 - 3.1 x10E3/uL 2.8 3.2 High     Monocytes Absolute 0.1 - 0.9 x10E3/uL 0.6 0.6    EOS (ABSOLUTE) 0.0 - 0.4 x10E3/uL 0.2 0.2    Basophils Absolute 0.0 - 0.2 x10E3/uL 0.1 0.1    Immature Granulocytes Not Estab. % 0 1    Immature Grans (Abs)         Component Ref Range & Units 14:00 (06/11/22) 9 mo ago (08/20/21) 9 mo ago (08/19/21)  Hemoglobin A1C  7.6 Abnormal  R 9.6 High  R, CM  HbA1c POC (<> result, manual entry) 4.0 - 5.6 % 9.3 R              ____________________________________________  EKG Occasional PACs at 81 bpm  ____________________________________________    ____________________________________________   INITIAL IMPRESSION / ASSESSMENT AND PLAN As part of my medical  decision making, I reviewed the following data within the electronic MEDICAL RECORD NUMBER       No acute finding on physical exam.  Discussed patient increased hemoglobin A1c.  Patient will follow-up with endocrinology diabetic control.      ____________________________________________   FINAL CLINICAL IMPRESSION     ED Discharge Orders     None        Note:  This document was prepared using Dragon voice recognition software and may include unintentional dictation errors.

## 2022-06-11 NOTE — Progress Notes (Signed)
Pt presents today to complete annual physical. Pt denies any issues or concerns at this time. /CL,RMA  

## 2022-12-22 DIAGNOSIS — E11319 Type 2 diabetes mellitus with unspecified diabetic retinopathy without macular edema: Secondary | ICD-10-CM | POA: Diagnosis not present

## 2022-12-22 DIAGNOSIS — H5213 Myopia, bilateral: Secondary | ICD-10-CM | POA: Diagnosis not present

## 2022-12-22 DIAGNOSIS — H524 Presbyopia: Secondary | ICD-10-CM | POA: Diagnosis not present

## 2023-06-25 ENCOUNTER — Ambulatory Visit: Payer: Self-pay

## 2023-06-25 DIAGNOSIS — E109 Type 1 diabetes mellitus without complications: Secondary | ICD-10-CM

## 2023-06-25 DIAGNOSIS — Z0000 Encounter for general adult medical examination without abnormal findings: Secondary | ICD-10-CM

## 2023-06-25 LAB — POCT URINALYSIS DIPSTICK
Bilirubin, UA: NEGATIVE
Blood, UA: NEGATIVE
Glucose, UA: POSITIVE — AB
Ketones, UA: POSITIVE
Leukocytes, UA: NEGATIVE
Nitrite, UA: NEGATIVE
Protein, UA: NEGATIVE
Spec Grav, UA: 1.02 (ref 1.010–1.025)
Urobilinogen, UA: 0.2 U/dL
pH, UA: 6 (ref 5.0–8.0)

## 2023-06-26 LAB — CMP12+LP+TP+TSH+6AC+PSA+CBC…
ALT: 23 IU/L (ref 0–44)
AST: 22 IU/L (ref 0–40)
Albumin: 4.1 g/dL (ref 4.1–5.1)
Alkaline Phosphatase: 165 IU/L — ABNORMAL HIGH (ref 44–121)
BUN/Creatinine Ratio: 19 (ref 9–20)
BUN: 13 mg/dL (ref 6–24)
Basophils Absolute: 0.1 10*3/uL (ref 0.0–0.2)
Basos: 1 %
Bilirubin Total: 0.3 mg/dL (ref 0.0–1.2)
Calcium: 8.7 mg/dL (ref 8.7–10.2)
Chloride: 99 mmol/L (ref 96–106)
Chol/HDL Ratio: 6.2 ratio — ABNORMAL HIGH (ref 0.0–5.0)
Cholesterol, Total: 167 mg/dL (ref 100–199)
Creatinine, Ser: 0.68 mg/dL — ABNORMAL LOW (ref 0.76–1.27)
EOS (ABSOLUTE): 0.2 10*3/uL (ref 0.0–0.4)
Eos: 2 %
Estimated CHD Risk: 1.3 times avg. — ABNORMAL HIGH (ref 0.0–1.0)
Free Thyroxine Index: 2.3 (ref 1.2–4.9)
GGT: 11 IU/L (ref 0–65)
Globulin, Total: 2.7 g/dL (ref 1.5–4.5)
Glucose: 193 mg/dL — ABNORMAL HIGH (ref 70–99)
HDL: 27 mg/dL — ABNORMAL LOW (ref >39)
Hematocrit: 46.4 % (ref 37.5–51.0)
Hemoglobin: 15.6 g/dL (ref 13.0–17.7)
Immature Grans (Abs): 0 10*3/uL (ref 0.0–0.1)
Immature Granulocytes: 0 %
Iron: 82 ug/dL (ref 38–169)
LDH: 145 IU/L (ref 121–224)
LDL Chol Calc (NIH): 126 mg/dL — ABNORMAL HIGH (ref 0–99)
Lymphocytes Absolute: 2.7 10*3/uL (ref 0.7–3.1)
Lymphs: 30 %
MCH: 30.2 pg (ref 26.6–33.0)
MCHC: 33.6 g/dL (ref 31.5–35.7)
MCV: 90 fL (ref 79–97)
Monocytes Absolute: 0.5 10*3/uL (ref 0.1–0.9)
Monocytes: 6 %
Neutrophils Absolute: 5.4 10*3/uL (ref 1.4–7.0)
Neutrophils: 61 %
Phosphorus: 3.2 mg/dL (ref 2.8–4.1)
Platelets: 346 10*3/uL (ref 150–450)
Potassium: 4.6 mmol/L (ref 3.5–5.2)
Prostate Specific Ag, Serum: 0.5 ng/mL (ref 0.0–4.0)
RBC: 5.16 x10E6/uL (ref 4.14–5.80)
RDW: 12.5 % (ref 11.6–15.4)
Sodium: 136 mmol/L (ref 134–144)
T3 Uptake Ratio: 30 % (ref 24–39)
T4, Total: 7.8 ug/dL (ref 4.5–12.0)
TSH: 0.554 u[IU]/mL (ref 0.450–4.500)
Total Protein: 6.8 g/dL (ref 6.0–8.5)
Triglycerides: 75 mg/dL (ref 0–149)
Uric Acid: 3.8 mg/dL (ref 3.8–8.4)
VLDL Cholesterol Cal: 14 mg/dL (ref 5–40)
WBC: 9 10*3/uL (ref 3.4–10.8)
eGFR: 113 mL/min/{1.73_m2} (ref >59)

## 2023-06-26 LAB — MICROALBUMIN / CREATININE URINE RATIO
Creatinine, Urine: 66.5 mg/dL
Microalb/Creat Ratio: 13 mg/g{creat} (ref 0–29)
Microalbumin, Urine: 8.9 ug/mL

## 2023-06-26 LAB — HGB A1C W/O EAG

## 2023-06-28 LAB — HGB A1C W/O EAG: Hgb A1c MFr Bld: 11.2 % — ABNORMAL HIGH (ref 4.8–5.6)

## 2023-06-28 LAB — SPECIMEN STATUS REPORT

## 2023-06-29 ENCOUNTER — Ambulatory Visit: Payer: Self-pay | Admitting: Physician Assistant

## 2023-06-29 ENCOUNTER — Encounter: Payer: Self-pay | Admitting: Physician Assistant

## 2023-06-29 VITALS — BP 148/88 | HR 97 | Temp 97.0°F | Resp 16 | Wt 148.6 lb

## 2023-06-29 DIAGNOSIS — Z0000 Encounter for general adult medical examination without abnormal findings: Secondary | ICD-10-CM

## 2023-06-29 DIAGNOSIS — E1065 Type 1 diabetes mellitus with hyperglycemia: Secondary | ICD-10-CM

## 2023-06-29 DIAGNOSIS — E109 Type 1 diabetes mellitus without complications: Secondary | ICD-10-CM

## 2023-06-29 NOTE — Progress Notes (Signed)
 Referral for Endocrinology put in Epic with Dr. Gordy Lauber of [facility] in [place], [place]. Physical notes with labs faxed to [phone]

## 2023-06-29 NOTE — Progress Notes (Signed)
 Here for yearly physical with provider and fills insulin at [facility] without a prescription and stated his sugar in morning is 150-200 and requests referral to endocrinology.

## 2023-06-29 NOTE — Progress Notes (Signed)
 [facility]   ____________________________________________   None    (approximate)  I have reviewed the triage vital signs and the nursing notes.   HISTORY  Chief Complaint No chief complaint on file.  HPI Jeremiah Becker is a 48 y.o. male patient presents for annual physical exam.  Patient does not follow-up at this clinic with endocrinology for his type 1 diabetes.  Patient purchased over-the-counter insulin and was not aware of his increased hemoglobin A1c until lab results recently.  Patient requests evaluation by endocrinologist.         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  Prior to Admission medications   Medication Sig Start Date End Date Taking? Authorizing Provider  blood glucose meter kit and supplies Use as directed. Check blood sugars daily. Occassionally fasting and occasionly post eating. 02/18/10  Yes [provider]  insulin regular (NOVOLIN R) 100 units/mL injection Inject 8 Units into the skin 3 (three) times daily before meals. 02/24/11  Yes [provider]    Allergies Patient has no known allergies.  Family History  Problem Relation Age of Onset   Hypertension Father    Diabetes Father    Hyperlipidemia Father    Heart attack Father     Social History Social History   Tobacco Use   Smoking status: Every  Day    Types: Cigarettes   Smokeless tobacco: Never  Vaping Use   Vaping status: Never Used  Substance Use Topics   Alcohol use: Never   Drug use: Never    Review of Systems Constitutional: No fever/chills Eyes: No visual changes. ENT: No sore throat. Cardiovascular: Denies chest pain. Respiratory: Denies shortness of breath. Gastrointestinal: No abdominal pain.  No nausea, no vomiting.  No diarrhea.  No constipation. Genitourinary: Negative for dysuria. Musculoskeletal: Negative for back pain. Skin: Negative for rash. Neurological: Negative for headaches, focal weakness or numbness. Endocrine: Diabetes and hyperlipidemia ____________________________________________   PHYSICAL EXAM:  VITAL SIGNS: BP 148/88BP. 148/88. Data is abnormal. Taken on 06/29/23 10:06 AM  Pulse Rate 97  Temp 97 F (36.1 C)Temp. 97 F (36.1 C). Data is abnormal. Taken on 06/29/23 10:06 AM  Weight 148 lb 9.6 oz (67.4 kg)  Resp 16  SpO2 98 %   Constitutional: Alert and oriented. Well appearing and in no acute distress. Eyes: Conjunctivae are normal. PERRL. EOMI. Head: Atraumatic. Nose: No congestion/rhinnorhea. Mouth/Throat: Mucous membranes are moist.  Oropharynx non-erythematous. Neck: No stridor.  No cervical spine tenderness to palpation. Hematological/Lymphatic/Immunilogical: No cervical lymphadenopathy. Cardiovascular: Normal rate, regular rhythm. Grossly normal heart sounds.  Good peripheral circulation. Respiratory: Normal respiratory effort.  No retractions. Lungs CTAB. Gastrointestinal: Soft and nontender. No distention. No abdominal bruits. No CVA tenderness. Genitourinary: Deferred Musculoskeletal: No lower extremity tenderness nor edema.  No joint effusions. Neurologic:  Normal speech and language. No gross focal neurologic deficits are  appreciated. No gait instability. Skin:  Skin is warm, dry and intact. No rash noted. Psychiatric: Mood and affect are normal. Speech and behavior are  normal.  ____________________________________________   LABS ____________________________________________  EKG       Component Ref Range & Units (hover) 4 d ago (06/25/23) 1 yr ago (06/09/22) 1 yr ago (08/19/21)  Color, UA Amber yellow Dark Public affairs consultant, UA Clear clear Clear  Glucose, UA Positive Abnormal  Positive Abnormal  CM Positive Abnormal  CM  Comment: 3+  Bilirubin, UA Negative neg Negative  Ketones, UA Positive neg Negative  Comment: 1+  Spec Grav, UA 1.020 1.025 >=1.030 Abnormal   Blood, UA Negative neg Negative  pH, UA 6.0 6.0 5.5  Protein, UA Negative Negative Positive Abnormal  CM  Urobilinogen, UA 0.2 0.2 0.2  Nitrite, UA Negative neg Negative  Leukocytes, UA Negative Negative Negative  Appearance     Odor                View All Conversations on this Encounter            Component Ref Range & Units (hover) 4 d ago (06/25/23) 4 d ago (06/25/23) 1 yr ago (06/11/22) 1 yr ago (08/20/21) 1 yr ago (08/19/21)  Hgb A1c MFr Bld CANCELED 11.2 High  R, CM R 7.6 Abnormal  R 9.6 High  R, CM  Comment: Please refer to the following specimen for additional lab results. see specimen [id number] 1          Prediabetes: 5.7 - 6.4          Diabetes: >6.4          Glycemic control for adults with diabetes: <7.0  Result canceled by the ancillary.  HbA1c POC (<> result, manual entry)   9.3 R    Resulting Agency [facility]          View All Conversations on this Encounter          Component Ref Range & Units (hover) 4 d ago 1 yr ago  Creatinine, Urine 66.5 143.2  Microalbumin, Urine 8.9 26.3  Microalb/Creat Ratio 13 18 CM  Comment:                        Normal:                0 -  29                        Moderately increased: 30 - 300                        Severely increased:       >300  Resulting Agency [facility]          View All Conversations on this Encounter             Component Ref Range & Units (hover) 4 d  ago (06/25/23) 1 yr ago (06/09/22) 1 yr ago (08/19/21) 10 yr ago (07/18/12) 10 yr ago (07/18/12)  Glucose 193 High  153 High  155 High  370 High  R   Uric Acid 3.8 3.3 Low  CM 4.0 CM    Comment:            Therapeutic target for gout patients: <6.0  BUN 13 13 16 17  R   Creatinine, Ser 0.68 Low  0.64 Low  0.82 0.83 R   eGFR 113 116 108    BUN/Creatinine Ratio 19 20 20     Sodium 136 137 137 132 Low  R   Potassium 4.6 4.3 5.4 High  3.4 Low  R   Chloride 99 102 100 99 R   Calcium 8.7 9.0 9.4 8.7 R   Phosphorus 3.2 3.4 3.5    Total Protein 6.8 6.8 7.0    Albumin 4.1 4.0 Low  4.3 R, CM    Globulin, Total 2.7 2.8 2.7    Bilirubin Total 0.3 0.3 0.3    Alkaline Phosphatase 165 High  141 High  153 High     LDH 145 146 157    AST 22 22 22     ALT 23 24 29     GGT 11 9 11     Iron 82 100 78    Cholesterol, Total 167 160 184    Triglycerides 75 68 84    HDL 27 Low  27 Low  28 Low     VLDL Cholesterol Cal 14 13 16     LDL Chol Calc (NIH) 126 High  120 High  140 High     Chol/HDL Ratio 6.2 High  5.9 High  CM 6.6 High  CM    Comment:                                   T. Chol/HDL Ratio                                             Men  Women                               1/2 Avg.Risk  3.4    3.3                                   Avg.Risk  5.0    4.4                                2X Avg.Risk  9.6    7.1                                3X Avg.Risk 23.4   11.0  Estimated CHD Risk 1.3 High  1.3 High  CM 1.4 High  CM    Comment: The CHD Risk is based on the T. Chol/HDL ratio. Other factors affect CHD Risk such as hypertension, smoking, diabetes, severe obesity, and family history of premature CHD.  TSH 0.554 0.822 1.020    T4, Total 7.8 7.2 6.8    T3 Uptake Ratio 30 30 27     Free Thyroxine Index 2.3 2.2 1.8    Prostate Specific Ag, Serum 0.5 0.5 CM 0.5 CM    Comment: Roche ECLIA methodology. According to the American Urological Association, Serum PSA should decrease and remain at undetectable  levels after radical prostatectomy. The AUA defines biochemical recurrence as an initial PSA value 0.2 ng/mL or greater followed by a subsequent confirmatory PSA value 0.2 ng/mL  or greater. Values obtained with different assay methods or kits cannot be used interchangeably. Results cannot be interpreted as absolute evidence of the presence or absence of malignant disease.  WBC 9.0 11.1 High  9.7  13.2 High  R  RBC 5.16 5.32 5.35  5.12 R  Hemoglobin 15.6 16.2 16.5    Hematocrit 46.4 47.5 48.2    MCV 90 89 90  91 R  MCH 30.2 30.5 30.8  31.7 R  MCHC 33.6 34.1 34.2  34.8 R  RDW 12.5 12.8 12.8  12.6 R  Platelets 346 312 369  302 R  Neutrophils 61 67 57    Lymphs 30 25 33    Monocytes 6 5 6     Eos 2 2 2     Basos 1 1 1     Neutrophils Absolute 5.4 7.4 High  5.6    Lymphocytes Absolute 2.7 2.8 3.2 High     Monocytes Absolute 0.5 0.6 0.6    EOS (ABSOLUTE) 0.2 0.2 0.2    Basophils Absolute 0.1 0.1 0.1    Immature Granulocytes 0 0 1    Immature Grans (Abs) 0.0 0.0 0.1    Resulting Agency [facility]          View All Conversations on this Encounter      important suggestion  Newer results are available. Click to view them now.           Component Ref Range & Units (hover) 4 d ago (06/25/23) 1 yr ago (06/11/22) 1 yr ago (08/20/21) 1 yr ago (08/19/21)  Hgb A1c MFr Bld 11.2 High  R 7.6 Abnormal  R 9.6 High  CM  Comment:          Prediabetes: 5.7 - 6.4          Diabetes: >6.4          Glycemic control for adults with diabetes: <7.0  HbA1c POC (<> result, manual entry)  9.3 R             Normal sinus rhythm 76 bpm ____________________________________________   ____________________________________________   INITIAL IMPRESSION / ASSESSMENT AND PLAN  As part of my medical decision making, I reviewed the following data within the electronic MEDICAL RECORD NUMBER Notes from prior ED visits and [place] Controlled Substance Database      No acute findings on physical exam or EKG.  Lab reveals hemoglobin A1c of 11.2.  Patient consulted with cardiology for definitive evaluation.  Discussed manage continuous glucose monitoring devices.  Patient will discuss options with endocrinology.        ____________________________________________   FINAL CLINICAL IMPRESSION  Annual physical exam uncontrolled diabetes.  ED Discharge Orders     None        Note:  This document was prepared using Dragon voice recognition software and may include unintentional dictation errors.

## 2023-06-29 NOTE — Addendum Note (Signed)
 Addended by: Walt Gunner on: 06/29/2023 10:50 AM   Modules accepted: Orders

## 2023-07-15 ENCOUNTER — Telehealth: Payer: Self-pay

## 2023-07-15 NOTE — Telephone Encounter (Signed)
   Received this notification from [facility].  They have trying to contact Jeremiah Becker about the endocrinology referral.  I tried to call him.  Went straight to voice mail.  Left message for him to call the clinic.  Calling him to see if he still wants to see an endocrinologist.  Rawleigh Cadet, RN [phone]

## 2023-07-20 ENCOUNTER — Telehealth: Payer: Self-pay

## 2023-07-20 DIAGNOSIS — E109 Type 1 diabetes mellitus without complications: Secondary | ICD-10-CM

## 2023-07-20 NOTE — Telephone Encounter (Signed)
 Hello ma'am, Sorry I haven't responded sooner as I have been on vacation. I was wondering if I could get a referral to Dr. Shelvy Dickens at [facility]. All of my family goes there. My wife knows him and can get me in with him. I just need a referral first. I spoke with Mr. Caryl Clas at my physical appointment and he said that would be fine. I'm really sorry for all the confusion. Thank you so much for your help with everything!  Thank you, Jeremiah Becker Sent from my iPhone  Referral put in Epic for Dr. Tara Fanti & referral faxed to [phone].

## 2023-10-24 ENCOUNTER — Encounter (INDEPENDENT_AMBULATORY_CARE_PROVIDER_SITE_OTHER): Payer: Self-pay

## 2023-11-18 ENCOUNTER — Telehealth: Payer: Self-pay

## 2023-11-18 DIAGNOSIS — E109 Type 1 diabetes mellitus without complications: Secondary | ICD-10-CM

## 2023-11-18 NOTE — Telephone Encounter (Signed)
 Good Morning,      I hope you are doing well. I was wondering if it would be possible to get a referral to an endocrinologist through the clinic. I really appreciate your help last time with Dr. Cherilyn. Appointments with him are so hard to come by and most of them are 3 to 4 months out. I have been struggling a little with my diabetes and would love it if I could get someone who was a little more accessible. Any help you could provide would be greatly appreciated. Thank you for all your help and all you do for us  here in [place].  Thank you so much, Jeremiah Becker   Received the above email from Jeremiah Becker today.  Called him to get additional information.  We made a referral to a local ([place]) Endocrinologist at patient's request on 07/20/2023.  [name] said it's hard to get an appointment a the local endocrinologist office & is requesting a new referral.  States he's willing to go anywhere.  Informed him we made a referral to Dr. Reyes Alexander of [facility] but cancelled it because he requested the local specialist.  States he's willing to go to [facility].  New referral put in Epic to Dr. Reyes Alexander & referral & notes faxed to [phone].

## 2023-11-19 ENCOUNTER — Telehealth: Payer: Self-pay

## 2023-11-22 DIAGNOSIS — E1065 Type 1 diabetes mellitus with hyperglycemia: Secondary | ICD-10-CM | POA: Diagnosis not present
